# Patient Record
Sex: Female | Born: 1960 | Race: White | Hispanic: Yes | Marital: Married | Smoking: Never smoker
Health system: Southern US, Community
[De-identification: ages and names within clinical notes are randomized; demographics above are authoritative.]

## PROBLEM LIST (undated history)

## (undated) DIAGNOSIS — M549 Dorsalgia, unspecified: Secondary | ICD-10-CM

## (undated) DIAGNOSIS — Z789 Other specified health status: Secondary | ICD-10-CM

## (undated) DIAGNOSIS — R270 Ataxia, unspecified: Secondary | ICD-10-CM

## (undated) DIAGNOSIS — G8929 Other chronic pain: Secondary | ICD-10-CM

## (undated) HISTORY — PX: TUBAL LIGATION: SHX77

## (undated) HISTORY — PX: LAPAROSCOPIC OVARIAN CYSTECTOMY: SUR786

---

## 2007-08-25 ENCOUNTER — Emergency Department (HOSPITAL_COMMUNITY): Admission: EM | Admit: 2007-08-25 | Discharge: 2007-08-25 | Payer: Self-pay | Admitting: Emergency Medicine

## 2007-12-01 LAB — CONVERTED CEMR LAB: Pap Smear: NEGATIVE

## 2007-12-14 ENCOUNTER — Other Ambulatory Visit: Admission: RE | Admit: 2007-12-14 | Discharge: 2007-12-14 | Payer: Self-pay | Admitting: Gynecology

## 2008-04-12 ENCOUNTER — Ambulatory Visit: Payer: Self-pay | Admitting: Obstetrics & Gynecology

## 2009-07-03 ENCOUNTER — Ambulatory Visit: Payer: Self-pay | Admitting: Obstetrics & Gynecology

## 2009-07-03 ENCOUNTER — Encounter: Payer: Self-pay | Admitting: Obstetrics & Gynecology

## 2009-07-03 LAB — CONVERTED CEMR LAB: Pap Smear: NEGATIVE

## 2009-09-27 ENCOUNTER — Ambulatory Visit (HOSPITAL_COMMUNITY): Admission: RE | Admit: 2009-09-27 | Discharge: 2009-09-27 | Payer: Self-pay | Admitting: Family Medicine

## 2009-10-29 ENCOUNTER — Ambulatory Visit (HOSPITAL_COMMUNITY): Admission: RE | Admit: 2009-10-29 | Discharge: 2009-10-29 | Payer: Self-pay | Admitting: *Deleted

## 2009-10-29 ENCOUNTER — Ambulatory Visit: Payer: Self-pay | Admitting: Family Medicine

## 2009-10-29 DIAGNOSIS — M545 Low back pain: Secondary | ICD-10-CM

## 2009-10-29 DIAGNOSIS — M542 Cervicalgia: Secondary | ICD-10-CM

## 2009-10-29 LAB — CONVERTED CEMR LAB
Albumin: 4.2 g/dL (ref 3.5–5.2)
Alkaline Phosphatase: 54 units/L (ref 39–117)
CO2: 25 meq/L (ref 19–32)
Chloride: 104 meq/L (ref 96–112)
Creatinine, Ser: 0.57 mg/dL (ref 0.40–1.20)
Glucose, Bld: 97 mg/dL (ref 70–99)
Hemoglobin: 11.8 g/dL — ABNORMAL LOW (ref 12.0–15.0)
Platelets: 286 10*3/uL (ref 150–400)
RBC: 3.91 M/uL (ref 3.87–5.11)
Sodium: 140 meq/L (ref 135–145)

## 2009-11-12 ENCOUNTER — Ambulatory Visit: Payer: Self-pay | Admitting: Family Medicine

## 2009-11-12 DIAGNOSIS — J309 Allergic rhinitis, unspecified: Secondary | ICD-10-CM

## 2010-02-12 ENCOUNTER — Emergency Department (HOSPITAL_COMMUNITY): Admission: EM | Admit: 2010-02-12 | Discharge: 2010-02-12 | Payer: Self-pay | Admitting: Emergency Medicine

## 2010-02-12 ENCOUNTER — Ambulatory Visit: Payer: Self-pay | Admitting: Obstetrics and Gynecology

## 2010-02-24 ENCOUNTER — Ambulatory Visit (HOSPITAL_COMMUNITY): Admission: RE | Admit: 2010-02-24 | Discharge: 2010-02-24 | Payer: Self-pay | Admitting: Family Medicine

## 2010-02-24 ENCOUNTER — Encounter: Payer: Self-pay | Admitting: Family Medicine

## 2010-03-04 ENCOUNTER — Ambulatory Visit: Payer: Self-pay | Admitting: Family Medicine

## 2010-03-04 DIAGNOSIS — R109 Unspecified abdominal pain: Secondary | ICD-10-CM

## 2010-03-04 DIAGNOSIS — N7013 Chronic salpingitis and oophoritis: Secondary | ICD-10-CM

## 2010-03-05 ENCOUNTER — Encounter: Payer: Self-pay | Admitting: Family Medicine

## 2010-03-19 ENCOUNTER — Ambulatory Visit: Payer: Self-pay | Admitting: Obstetrics and Gynecology

## 2010-04-10 ENCOUNTER — Encounter: Payer: Self-pay | Admitting: Family Medicine

## 2010-04-24 ENCOUNTER — Ambulatory Visit: Payer: Self-pay | Admitting: Obstetrics and Gynecology

## 2010-09-29 ENCOUNTER — Encounter: Payer: Self-pay | Admitting: *Deleted

## 2010-09-29 ENCOUNTER — Ambulatory Visit: Payer: Self-pay | Admitting: Family Medicine

## 2010-11-26 IMAGING — US US TRANSVAGINAL NON-OB
1 series · 14 of 25 positions shown · non-contrast
Comparison: CT on 02/12/2010

CLINICAL DATA: Pelvic pain.  Uterine mass and cystic left adnexal
lesion noted on recent CT.

TRANSABDOMINAL AND TRANSVAGINAL ULTRASOUND OF PELVIS
TECHNIQUE: Both transabdominal and transvaginal ultrasound
examinations of the pelvis were performed including evaluation of
the uterus, ovaries, adnexal regions, and pelvic cul-de-sac.

[Series 1: us transvaginal non-ob · 0.27mm/px · 14 of 56 slices shown]
[im 1/56]
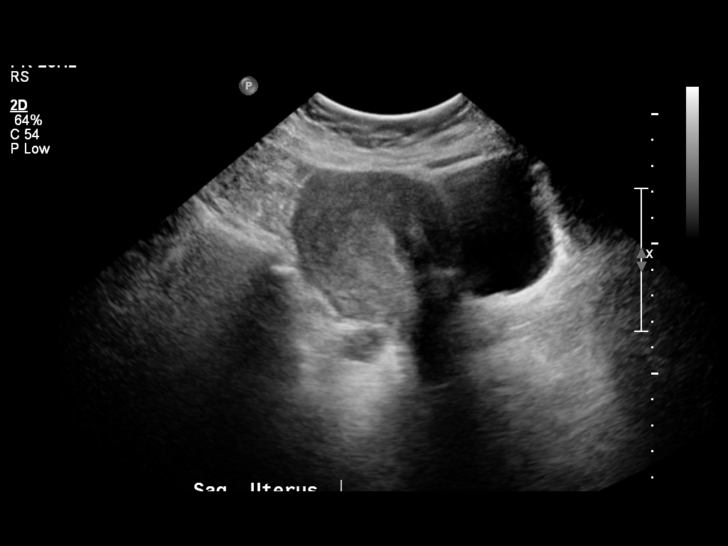
[im 5/56]
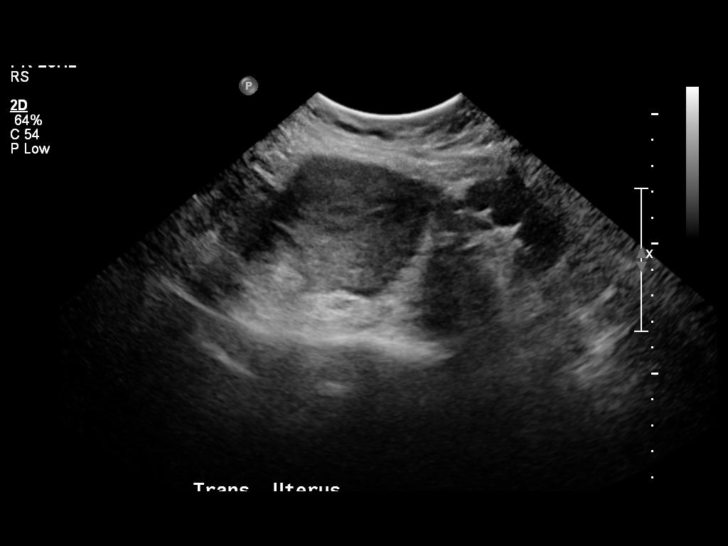
[im 10/56]
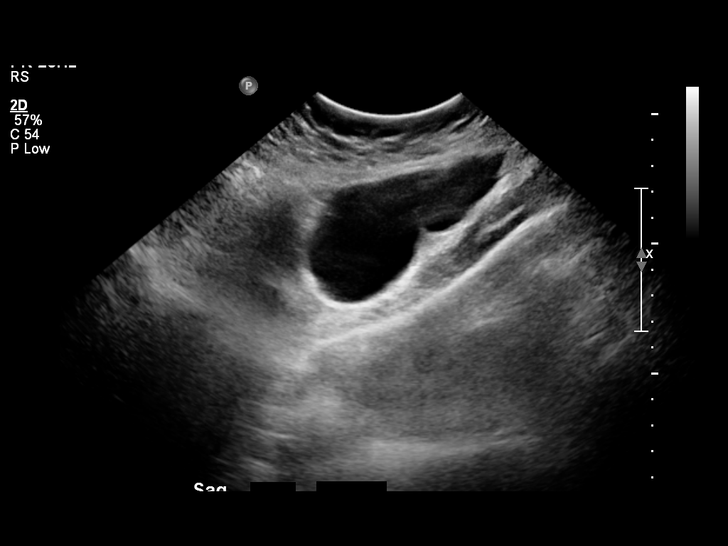
[im 14/56]
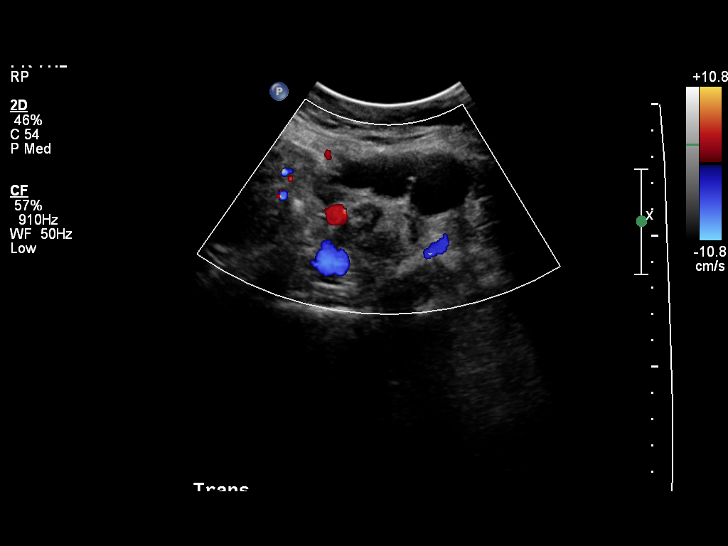
[im 19/56]
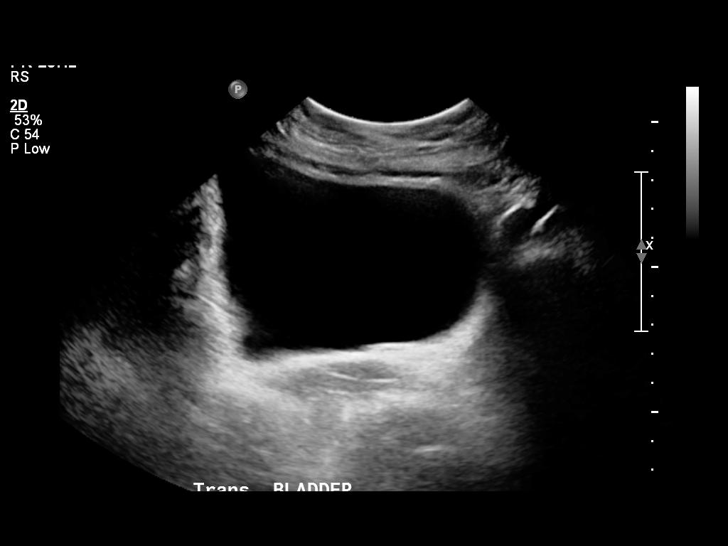
[im 21/56]
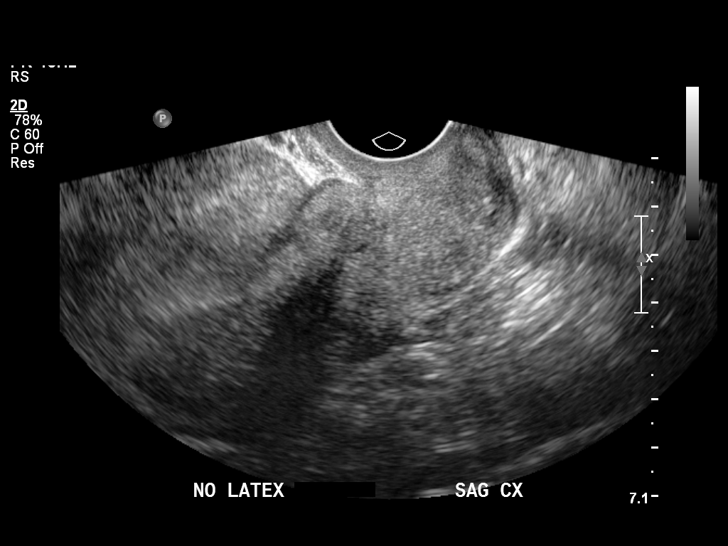
[im 26/56]
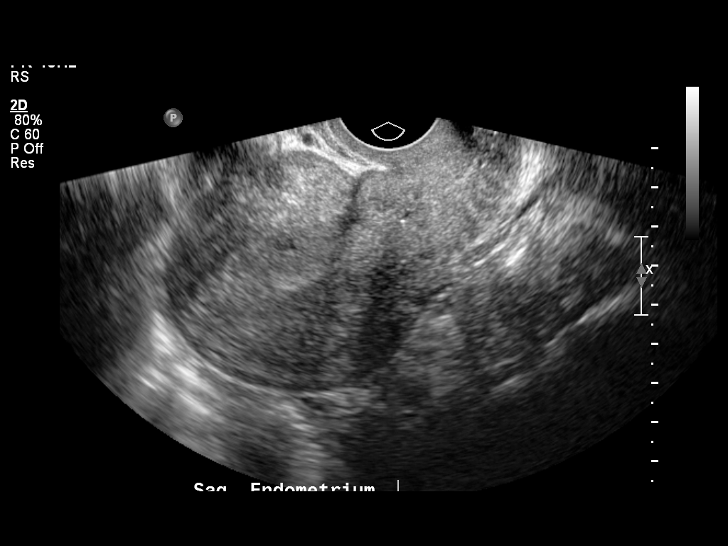
[im 30/56]
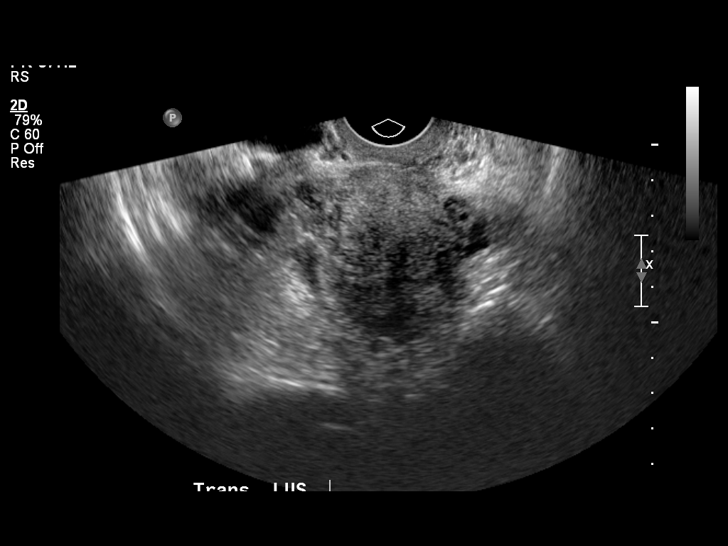
[im 35/56]
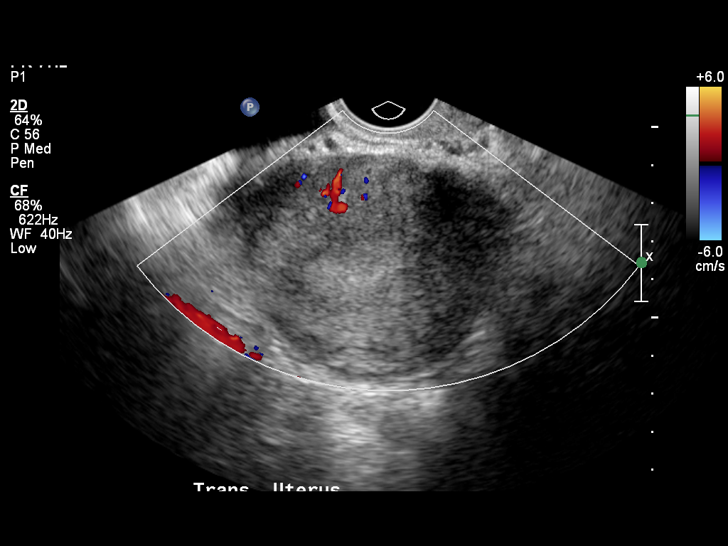
[im 37/56]
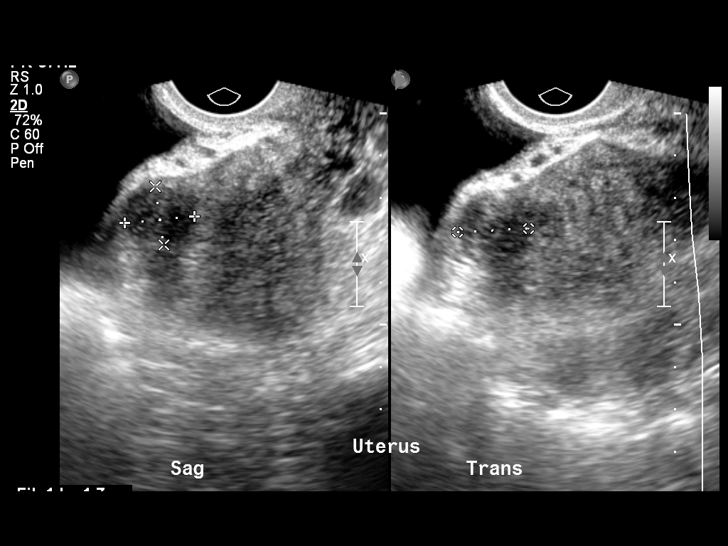
[im 42/56]
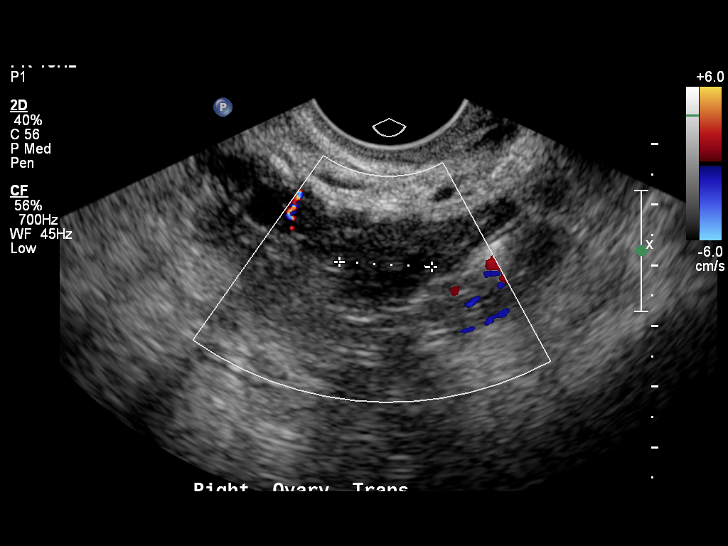
[im 46/56]
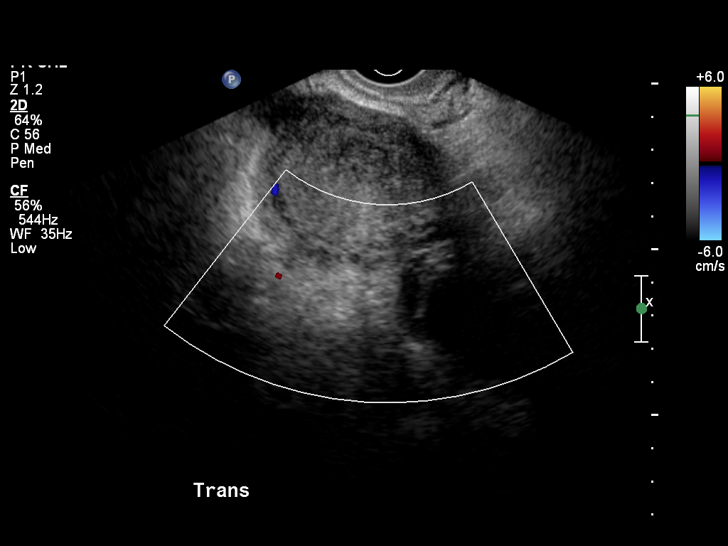
[im 51/56]
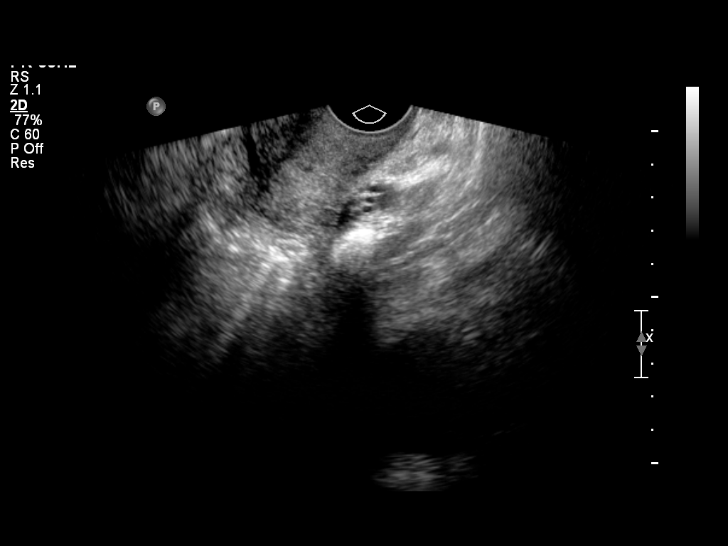
[im 56/56]
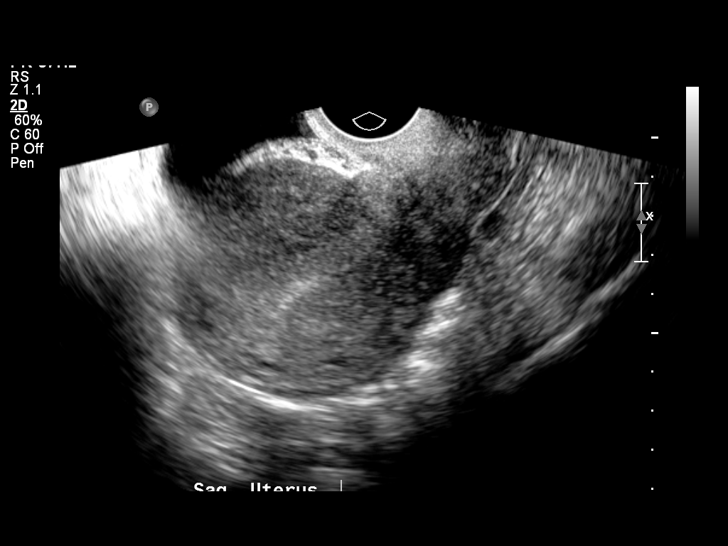

[14 of 25 positions shown; findings below may reference images not displayed]

FINDINGS: Uterus measures 9.8 x 5.8 x 6.3 cm. Diffusely heterogeneous
myometrial echotexture noted.  Previous cesarean section scar seen.
A subserosal fibroid is seen in the right anterior uterine body
measuring 1.7 cm.

Endometrium measures 10 mm in thickness.  Within normal limits in
appearance.

Right Ovary measures 2.6 x 1.1 x 1.5 cm. Normal appearance.

Left Ovary measures 2.3 x 1.5 x 1.7 cm.  Normal appearance.

Other Findings:  A tubular cystic structure is seen in the left
adnexa adjacent to the left ovary which measures approximately 4 x
8 cm.  This corresponds with the cystic lesion seen on recent CT.
IMPRESSION: 1.  Diffusely heterogeneous myometrium, with 1.7 cm fibroid.
2.  Normal ovaries.
3.  Moderate left hydrosalpinx, which corresponds to the cystic
lesion seen on recent CT.

## 2010-12-30 NOTE — Miscellaneous (Signed)
Summary: PT/TS  Clinical Lists Changes faxed PT request to [facility] location. original given to pt.Arlyss Repress CMA,  September 29, 2010 5:13 PM

## 2010-12-30 NOTE — Consult Note (Signed)
[facility]   Imported By: Bradly Bienenstock 03/05/2010 11:11:20  _____________________________________________________________________  External Attachment:    Type:   Image     Comment:   External Document  Appended Document: [facility] PMH of hysterectomy incorrect

## 2010-12-30 NOTE — Consult Note (Signed)
Summary: [facility]   Imported By: Clydell Hakim 05/02/2010 15:18:57  _____________________________________________________________________  External Attachment:    Type:   Image     Comment:   External Document

## 2010-12-30 NOTE — Assessment & Plan Note (Signed)
Summary: back pain/mj   Vital Signs:  Patient profile:   50 year old female Menstrual status:  regular Height:      59.75 inches Weight:      128.6 pounds BMI:     25.42 Pulse rate:   87 / minute BP sitting:   120 / 78  (right arm)  Vitals Entered By: Arlyss Repress CMA, (September 29, 2010 4:49 PM) CC: lower back pain Is Patient Diabetic? No Pain Assessment Patient in pain? yes     Location: lower back Intensity: 9 Onset of pain  x 1 week   Primary Care Provider:  Zachery Dauer MD  CC:  lower back pain.  History of Present Illness: 1. Lower back pain: - Pt presents with lower back pain for the past week - It started when she was at work, doing a lot of bending over - She has had this happen multiple times before and it would get better after a couple of weeks - Pain located diffusely in the lower back and some in the hips - Pain rated a 9/10 currently - Pain is also in the hips but it doesn't radiate. - Worse with bending over and with rotation - Vicodin and Advil makes it feel better  ROS: denies numbness / weakness, saddle anesthesia, loss of bowel / bladder function  SocHx:  Works cleaning houses  Habits & Providers  Alcohol-Tobacco-Diet     Tobacco Status: never  Current Medications (verified): 1)  Ibuprofen 200 Mg Tabs (Ibuprofen) .... Take 2 Tabs Three Times A Day With Meals 2)  Fluticasone Propionate 50 Mcg/act Susp (Fluticasone Propionate) .... 2 Sprays Each Nostril Daily  Allergies: No Known Drug Allergies  Physical Exam  General:  Vitals reviewed.  No acute distress Neck:  full ROM.   Lungs:  normal respiratory effort.   Heart:  normal rate and regular rhythm.   Msk:  Lower back:  No gross swelling, redness or warmth.  Full flexion.  Decreased extension and decreased rotation.  Non tender to palpation except minimally at the SI joints.  Negative SLR bilaterally.  Hips:  Decreased internal rotation otherwise normal ROM Extremities:  no LE  edema Neurologic:  strength normal in all extremities, sensation intact to light touch, gait normal, and DTRs symmetrical and normal.     Impression & Recommendations:  Problem # 1:  LOW BACK PAIN, CHRONIC (ICD-724.2) Assessment Deteriorated  Worse over the past week.  Likely exacerbated at work by doing so much bending over.  No red flags.  She prefers not to keep taking medicine.  Will send to PT for evaluation and treatment.  RTC in 4-6 weeks. Her updated medication list for this problem includes:    Ibuprofen 200 Mg Tabs (Ibuprofen) .Marland Kitchen... Take 2 tabs three times a day with meals  Orders: [facility]- Est Level  3 (16109)  Complete Medication List: 1)  Ibuprofen 200 Mg Tabs (Ibuprofen) .... Take 2 tabs three times a day with meals 2)  Fluticasone Propionate 50 Mcg/act Susp (Fluticasone propionate) .... 2 sprays each nostril daily  Patient Instructions: 1)  You likely strained your back from bending over so much at work 2)  We will send you to the physical therapist to see if they can help you with certain exercises to help you with your back pain and help prevent it from keep coming back 3)  Please schedule a follow up apppointment in 4-6 weeks to recheck your back   Orders Added: 1)  [facility]- Est  Level  3 [99213]

## 2010-12-30 NOTE — Letter (Signed)
Summary: *Referral Letter  [facility]  [address]   [place], [place] [address]   Phone: [phone]  Fax: [phone]    03/05/2010  Vanessa Beltran [address] [place], [place]  [address]  Phone: [phone]  Reason for Referral: Information for follow-up care. I saw Vanessa Beltran 03/04/10 and reviewed her history in [place]. Someone had told her that she might need colonoscopy. A rectal exam showed mild ext hemorrhoids and heme neg stools. Since she is not anemic, I don't believe colonoscopy is indicated. If her pain doesn't improve after treatment of her hydrosalpinx and is recurrently associated with diarrhea, I'll be happy to re-evaluate her. The urology consult from Dr Brunilda Payor indicated the small renal stones were not the cause of her pain.   Current Medical Problems: 1)  ALLERGIC RHINITIS, CHRONIC (ICD-477.9) 2)  COUGH (ICD-786.2) 3)  ABDOMINAL PAIN RIGHT UPPER QUADRANT (ICD-789.01) 4)  LOW BACK PAIN, CHRONIC (ICD-724.2) 5)  NECK PAIN (ICD-723.1) 6)  FAMILY HISTORY OF CAD FEMALE 1ST DEGREE RELATIVE <50 (ICD-V17.3) 7)  FAMILY HISTORY OF CAD FEMALE 1ST DEGREE RELATIVE <60 (ICD-V16.49) 8)  FAMILY HISTORY DIABETES 1ST DEGREE RELATIVE (ICD-V18.0)   Current Medications: 1)  IBUPROFEN 200 MG TABS (IBUPROFEN) Take 2 tabs three times a day with meals 2)  ROBAFEN AC 100-10 MG/5ML SYRP (GUAIFENESIN-CODEINE) Take 1-2 tsp every 4 hours as needed cough 3)  FLUTICASONE PROPIONATE 50 MCG/ACT SUSP (FLUTICASONE PROPIONATE) 2 sprays each nostril daily   Past Medical History: 1)  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 Loc.  WBC                                      10.3              4.0-10.5         K/uL  RBC  4.11              3.87-5.11        MIL/uL  Hemoglobin (HGB)                         12.5              12.0-15.0        g/dL  Hematocrit (HCT)                         38.8              36.0-46.0        %  MCV                                      94.3              78.0-100.0       fL  MCHC                                     32.3              30.0-36.0        g/dL  RDW                                      14.2              11.5-15.5        %  Platelet Count (PLT)                     244               150-400          K/uL  Neutrophils, %                           87         h      43-77            %  Lymphocytes, %                           7          l      12-46            %  Monocytes, %                             5                 3-12             %  Eosinophils, %                           1                 0-5              %  Basophils, %  0                 0-1              %  Neutrophils, Absolute                    8.9        h      1.7-7.7          K/uL  Lymphocytes, Absolute                    0.7               0.7-4.0          K/uL  Monocytes, Absolute                      0.5               0.1-1.0          K/uL  Eosinophils, Absolute                    0.1               0.0-0.7          K/uL  Basophils, Absolute                      0.0               0.0-0.1          K/uL  Additional Information  HL7 RESULT STATUS : F  External IF Update Timestamp : 2010-02-12:02:57:00.000000   Thank you again for agreeing to see our patient; please contact us if you have any further questions or need additional information.  Sincerely,  Zachery Dauer MD  Appended Document: *Referral Letter Faxed  to Dr. Jolayne Panther @ [phone]

## 2010-12-30 NOTE — Assessment & Plan Note (Signed)
Summary: needs referral to ob/gyn,tcb   Vital Signs:  Patient profile:   50 year old female Menstrual status:  regular Height:      59.75 inches Weight:      125 pounds BMI:     24.71 Temp:     97.5 degrees F oral BP sitting:   109 / 75  (left arm) Cuff size:   regular  Vitals Entered By: San Morelle, SMA CC: pt needs referral for colonscopy? Is Patient Diabetic? No Pain Assessment Patient in pain? no        Primary Care Provider:  Zachery Dauer MD  CC:  pt needs referral for colonscopy?Marland Kitchen  History of Present Illness: Every 3 nonths has colicky pain in lower abdomen associated with diarrhea lasts a few hours with 2-3 loose bowel movement. Last time one month ago. Can be on either side.  March visit to the ER for nausea, abdomen pain and fainted briefly and fell without injury. CT scan showed a left hydrosalpinx and small uterine fibroids so she was referred to Dr Jolayne Panther, gynecologist who ordered an ultrasound that confirmed those findings. She will see her again soon to make a decision about surgery if the finances have been arranged.   She was also referred to Dr Brunilda Payor for evaluation of small renal stones found on the CT scan. His consult states these are small and not the cause of the pain and recommended restudy in 6 months.   She is here today because she thought she needed a referral for a colonoscopy  Also complains of sinus congestion and bad smell and asks for treatment. Is taking over the counter medicine for congestion. Was given other pills before that helped, not an antibiotic.   Habits & Providers  Alcohol-Tobacco-Diet     Tobacco Status: never  Allergies: No Known Drug Allergies  Family History: MGF - Cancer of the skin,  intestine, lung died 50 Family History Diabetes 1st degree relative - mother Family History of CAD Female 1st degree relative 51 - father 5 Siblings well  Physical Exam  General:  alert, well-developed, and well-nourished.   Head:  No  maxillary tenderness. Indicates headache are in forehead.  Ears:  External ear exam shows no significant lesions or deformities.  Otoscopic examination reveals clear canals, tympanic membranes are intact bilaterally without bulging, retraction, inflammation or discharge. Hearing is grossly normal bilaterally. Nose:  External nasal examination shows no deformity or inflammation. Nasal mucosa are pink and moist without lesions or exudates. Mouth:  Oral mucosa and oropharynx without lesions or exudates.  Teeth in good repair. Neck:  No deformities, masses, or tenderness noted. Lungs:  Normal respiratory effort, chest expands symmetrically. Lungs are clear to auscultation, no crackles or wheezes. Heart:  Normal rate and regular rhythm. S1 and S2 normal without gallop, murmur, click, rub or other extra sounds. Abdomen:  Bowel sounds positive,abdomen soft and non-tender without masses, organomegaly or hernias noted. Rectal:  external hemorrhoidal tags.   no masses. Brown heme neg stool   Impression & Recommendations:  Problem # 1:  ABDOMINAL PAIN, LOWER (ICD-789.09)  Her updated medication list for this problem includes:    Ibuprofen 200 Mg Tabs (Ibuprofen) .Marland Kitchen... 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 2 sprays each nostril daily  Complete Medication List: 1)  Ibuprofen 200 Mg Tabs (Ibuprofen) .... Take 2 tabs three times a day with meals 2)  Fluticasone Propionate 50 Mcg/act Susp (Fluticasone propionate) .... 2 sprays each nostril daily  Patient Instructions: 1)  Puede usar el espray para la Darene Lamer y las pastillas de la farmacia  para sinusitis de Programmer, multimedia.  2)  Voy a Orthoptist.  Prescriptions: FLUTICASONE PROPIONATE 50 MCG/ACT SUSP (FLUTICASONE PROPIONATE) 2 sprays each nostril daily  #1 x 11   Entered and Authorized by:   Zachery Dauer MD   Signed by:   Zachery Dauer MD on 03/04/2010   Method used:   Print then Give to Patient   RxID:   [id number]    Vital Signs:  Patient profile:   50 year old female Menstrual status:  regular Height:      59.75 inches Weight:      125 pounds BMI:     24.71 Temp:     97.5 degrees F oral BP sitting:   109 / 75  (left arm) Cuff size:   regular  Vitals Entered By: San Morelle, SMA

## 2011-02-23 LAB — COMPREHENSIVE METABOLIC PANEL
AST: 19 U/L (ref 0–37)
Alkaline Phosphatase: 48 U/L (ref 39–117)
BUN: 14 mg/dL (ref 6–23)
Chloride: 106 mEq/L (ref 96–112)
GFR calc non Af Amer: >60 mL/min (ref >60)
Potassium: 3.3 mEq/L — ABNORMAL LOW (ref 3.5–5.1)
Sodium: 139 mEq/L (ref 135–145)
Total Bilirubin: 0.6 mg/dL (ref 0.3–1.2)

## 2011-02-23 LAB — URINALYSIS, ROUTINE W REFLEX MICROSCOPIC
Bilirubin Urine: NEGATIVE
Glucose, UA: NEGATIVE mg/dL
Leukocytes, UA: NEGATIVE
Protein, ur: 100 mg/dL — AB
Specific Gravity, Urine: 1.023 (ref 1.005–1.030)
pH: 7.5 (ref 5.0–8.0)

## 2011-02-23 LAB — CBC
Platelets: 244 10*3/uL (ref 150–400)
RDW: 14.2 % (ref 11.5–15.5)

## 2011-02-23 LAB — URINE CULTURE

## 2011-02-23 LAB — DIFFERENTIAL
Lymphs Abs: 0.7 10*3/uL (ref 0.7–4.0)
Monocytes Relative: 5 % (ref 3–12)

## 2011-02-23 LAB — URINE MICROSCOPIC-ADD ON

## 2011-04-14 NOTE — Group Therapy Note (Signed)
Vanessa Beltran, SPEGAL NO.:  [id number]   MEDICAL RECORD NO.:  [id number]          PATIENT TYPE:  WOC   LOCATION:  [facility]                   FACILITY:  [facility]   PHYSICIAN:  Jaynie Collins, MD     DATE OF BIRTH:  09/23/1961   DATE OF SERVICE:  07/03/2009                                  CLINIC NOTE   CHIEF COMPLAINT:  Annual exam.   HISTORY OF PRESENT ILLNESS:  The patient is a 50 year old gravida 3,  para 2-0-1-2 who was last seen in May 2009 for evaluation of chronic  left lower quadrant pain and left hydrosalpinx that was seen in  ultrasound.  At that visit, the patient was counseled regarding  laparoscopy for workup of her chronic pelvic pain and possible removal  of her left tube; however, she is self pay and the decision was made  that she would undergo financial counseling and come back for  preoperative planning.  The patient has not been seen since then and  today, she reports that she continues to have chronic left lower  quadrant pain.  She has not met with a Artist and wants  further information and how to set that up.  She says that she has been  having increasing dyspareunia because of the left hydrosalpinx and she  does desire surgical management.  Apart from her pain, she has no  gynecologic concerns.   PAST OB/GYN HISTORY:  The patient had two cesarean sections.  She has  normal menstrual periods.  She had a tubal ligation.  She has normal Pap  smears.  Last one was in January 2009, her last mammogram was in January  2009, which was negative.   PAST MEDICAL HISTORY:  None.   PAST SURGICAL HISTORY:  Two cesarean sections and a bilateral tubal  sterilization.   MEDICATIONS:  Antihistamine as needed.   ALLERGIES:  No known drug allergies.  ACIDIC FRUITS do cause her some  allergy.   SOCIAL HISTORY:  The patient does work outside the home.  She lives with  her family.  She denies any habits.  Denies any past or current history  of sexual or physical abuse.   FAMILY HISTORY:  Only remarkable for an unspecified type of cancer that  her grandfather had and arterial hypertension.  She denies any  gynecologic familial cancer history.   REVIEW OF SYSTEMS:  Unremarkable for chronic left lower quadrant pain.   PHYSICAL EXAMINATION:  VITALS:  97.7, pulse 66, blood pressure 147/86,  respirations 16, weight 124.5 pounds, height 5 feet.  GENERAL:  No apparent distress.  HEENT:  Normocephalic, atraumatic.  The patient does have about 1-cm  lesion under her right chin, which is flesh colored, symmetric in size.  She says that it has been excised in the past, but it regrew.  The  patient has no other neck masses.  NECK:  Supple.  Normal thyroid.  LUNGS:  Clear to auscultation bilaterally.  HEART:  Regular rate and rhythm.  BREASTS:  Symmetric in size, nontender.  No abnormal masses, skin  changes, nipple drainage, or lymphadenopathy noted.  LUNGS:  Clear to  auscultation bilaterally.  HEART:  Regular rate and rhythm.  ABDOMEN:  Soft, nontender, nondistended.  Well-healed Pfannenstiel  incision.  EXTREMITIES:  No cyanosis, clubbing, or edema.  PELVIC:  Normal external female genitalia.  Pink well rugated vagina.  No lesions.  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  Pap smear was obtained and endocervical sample was able to be  obtained, but after some difficulty.  On bimanual exam, the patient has  nontender uterus and had bilateral adnexal fullness, but no tenderness  on palpation.   ASSESSMENT/PLAN:  The patient is a 50 year old gravida 3, prior 2-0-1-2  here for annual exam.  Pap smear was sent.  The patient has a normal  breast examination and will be scheduled for mammogram at the end of  this visit.  For her left lower quadrant pain and persistent left  hydrosalpinx, the patient was told to meet with a financial counselor  and then call or make another appointment for preoperative  planning.  She agrees with this plan.           ______________________________  Jaynie Collins, MD     UA/MEDQ  D:  07/03/2009  T:  07/04/2009  Job:  [id number]

## 2013-06-16 ENCOUNTER — Other Ambulatory Visit (HOSPITAL_COMMUNITY): Payer: Self-pay | Admitting: Internal Medicine

## 2013-06-16 DIAGNOSIS — Z1231 Encounter for screening mammogram for malignant neoplasm of breast: Secondary | ICD-10-CM

## 2013-06-26 ENCOUNTER — Ambulatory Visit (HOSPITAL_COMMUNITY)
Admission: RE | Admit: 2013-06-26 | Discharge: 2013-06-26 | Disposition: A | Discharge status: Home / Self Care | Payer: BC Managed Care – PPO | Source: Ambulatory Visit | Attending: Internal Medicine | Admitting: Internal Medicine

## 2013-06-26 DIAGNOSIS — Z1231 Encounter for screening mammogram for malignant neoplasm of breast: Secondary | ICD-10-CM | POA: Insufficient documentation

## 2013-08-25 ENCOUNTER — Other Ambulatory Visit: Payer: Self-pay | Admitting: Obstetrics & Gynecology

## 2013-08-25 DIAGNOSIS — N9489 Other specified conditions associated with female genital organs and menstrual cycle: Secondary | ICD-10-CM

## 2013-09-05 ENCOUNTER — Ambulatory Visit
Admission: RE | Admit: 2013-09-05 | Discharge: 2013-09-05 | Disposition: A | Discharge status: Home / Self Care | Payer: BC Managed Care – PPO | Source: Ambulatory Visit | Attending: Obstetrics & Gynecology | Admitting: Obstetrics & Gynecology

## 2013-09-05 DIAGNOSIS — N9489 Other specified conditions associated with female genital organs and menstrual cycle: Secondary | ICD-10-CM

## 2013-09-05 MED ORDER — GADOBENATE DIMEGLUMINE 529 MG/ML IV SOLN
11.0000 mL | Freq: Once | INTRAVENOUS | Status: AC | PRN
  Administered 2013-09-05: 11 mL via INTRAVENOUS

## 2013-10-16 ENCOUNTER — Encounter (HOSPITAL_COMMUNITY)
Admission: RE | Admit: 2013-10-16 | Discharge: 2013-10-16 | Disposition: A | Discharge status: Home / Self Care | Payer: BC Managed Care – PPO | Source: Ambulatory Visit | Attending: Obstetrics & Gynecology | Admitting: Obstetrics & Gynecology

## 2013-10-16 ENCOUNTER — Encounter (HOSPITAL_COMMUNITY): Payer: Self-pay

## 2013-10-16 HISTORY — DX: Other specified health status: Z78.9

## 2013-10-16 LAB — CBC
Hemoglobin: 12.5 g/dL (ref 12.0–15.0)
MCH: 30.6 pg (ref 26.0–34.0)
MCHC: 33.4 g/dL (ref 30.0–36.0)
MCV: 91.7 fL (ref 78.0–100.0)
Platelets: 258 10*3/uL (ref 150–400)

## 2013-10-16 NOTE — Patient Instructions (Signed)
20 Vanessa Beltran  10/16/2013   Your procedure is scheduled on:  10/18/13  Enter through the Main Entrance of [facility] at 7 AM.  Pick up the phone at the desk and dial [phone].   Call this number if you have problems the morning of surgery: [phone]   Remember:   Do not eat food:After Midnight.  Do not drink clear liquids: After Midnight.  Take these medicines the morning of surgery with A SIP OF WATER: NA   Do not wear jewelry, make-up or nail polish.  Do not wear lotions, powders, or perfumes. You may wear deodorant.  Do not shave 48 hours prior to surgery.  Do not bring valuables to the hospital.  [facility] is not   responsible for any belongings or valuables brought to the hospital.  Contacts, dentures or bridgework may not be worn into surgery.  Leave suitcase in the car. After surgery it may be brought to your room.  For patients admitted to the hospital, checkout time is 11:00 AM the day of              discharge.   Patients discharged the day of surgery will not be allowed to drive             home.  Name and phone number of your driver: NA  Special Instructions:   Shower using CHG 2 nights before surgery and the night before surgery.  If you shower the day of surgery use CHG.  Use special wash - you have one bottle of CHG for all showers.  You should use approximately 1/3 of the bottle for each shower.   Please read over the following fact sheets that you were given:   Surgical Site Infection Prevention

## 2013-10-17 NOTE — H&P (Signed)
Vanessa Beltran is an 52 y.o. female with left-sided pelvic pain.  Ultrasound and MRI show likely 9.5 cm left hydrosalpinx.    Pertinent Gynecological History: Menses: with minimal cramping Bleeding: n/a Contraception: tubal ligation DES exposure: unknown Blood transfusions: none Sexually transmitted diseases: no past history Previous GYN Procedures: C/S x 2, BTL, l/s cystectomy  Last mammogram: normal Date: 2014 Last pap: normal Date: 2014 OB History: G3, P3  Menstrual History: Menarche age: n/a No LMP recorded.    Past Medical History  Diagnosis Date  . Medical history non-contributory     Past Surgical History  Procedure Laterality Date  . Tubal ligation    . Cesarean section    . Laparoscopic ovarian cystectomy      No family history on file.  Social History:  reports that she has never smoked. She does not have any smokeless tobacco history on file. She reports that she does not drink alcohol or use illicit drugs.  Allergies: Not on File  No prescriptions prior to admission    ROS  There were no vitals taken for this visit. Physical Exam  Constitutional: She is oriented to person, place, and time. She appears well-developed and well-nourished.  Cardiovascular: Normal rate and regular rhythm.   Respiratory: Effort normal and breath sounds normal.  GI: Soft. There is no rebound and no guarding.  Neurological: She is alert and oriented to person, place, and time.  Skin: Skin is warm and dry.  Psychiatric: She has a normal mood and affect. Her behavior is normal.    No results found for this or any previous visit (from the past 24 hour(s)).  No results found.  Assessment/Plan: 52yo with left sided pelvic pain; suspect hydrosalpinx Plan bilateral salpingo-oophorectomy  Sabre Romberger 10/17/2013, 7:57 PM

## 2013-10-18 ENCOUNTER — Ambulatory Visit (HOSPITAL_COMMUNITY): Payer: BC Managed Care – PPO | Admitting: Certified Registered"

## 2013-10-18 ENCOUNTER — Observation Stay (HOSPITAL_COMMUNITY)
Admission: RE | Admit: 2013-10-18 | Discharge: 2013-10-19 | Disposition: A | Discharge status: Home / Self Care | Payer: BC Managed Care – PPO | Source: Ambulatory Visit | Attending: Obstetrics & Gynecology | Admitting: Obstetrics & Gynecology

## 2013-10-18 ENCOUNTER — Encounter (HOSPITAL_COMMUNITY): Payer: Self-pay | Admitting: Certified Registered"

## 2013-10-18 ENCOUNTER — Encounter (HOSPITAL_COMMUNITY)
Admission: RE | Disposition: A | Discharge status: Home / Self Care | Payer: Self-pay | Source: Ambulatory Visit | Attending: Obstetrics & Gynecology

## 2013-10-18 ENCOUNTER — Encounter (HOSPITAL_COMMUNITY): Payer: BC Managed Care – PPO | Admitting: Certified Registered"

## 2013-10-18 DIAGNOSIS — N7013 Chronic salpingitis and oophoritis: Secondary | ICD-10-CM | POA: Insufficient documentation

## 2013-10-18 DIAGNOSIS — N83209 Unspecified ovarian cyst, unspecified side: Secondary | ICD-10-CM | POA: Insufficient documentation

## 2013-10-18 DIAGNOSIS — N9489 Other specified conditions associated with female genital organs and menstrual cycle: Secondary | ICD-10-CM | POA: Insufficient documentation

## 2013-10-18 DIAGNOSIS — N949 Unspecified condition associated with female genital organs and menstrual cycle: Principal | ICD-10-CM | POA: Insufficient documentation

## 2013-10-18 DIAGNOSIS — N736 Female pelvic peritoneal adhesions (postinfective): Secondary | ICD-10-CM | POA: Insufficient documentation

## 2013-10-18 DIAGNOSIS — N838 Other noninflammatory disorders of ovary, fallopian tube and broad ligament: Secondary | ICD-10-CM | POA: Insufficient documentation

## 2013-10-18 HISTORY — PX: LAPAROSCOPIC BILATERAL SALPINGECTOMY: SHX5889

## 2013-10-18 HISTORY — PX: CYSTOSCOPY: SHX5120

## 2013-10-18 HISTORY — PX: LAPAROSCOPIC LYSIS OF ADHESIONS: SHX5905

## 2013-10-18 HISTORY — PX: LAPAROTOMY: SHX154

## 2013-10-18 LAB — PREGNANCY, URINE: Preg Test, Ur: NEGATIVE

## 2013-10-18 SURGERY — SALPINGECTOMY, BILATERAL, LAPAROSCOPIC
Anesthesia: General | Site: Bladder | Laterality: Right | Wound class: Clean Contaminated

## 2013-10-18 MED ORDER — IBUPROFEN 600 MG PO TABS
600.0000 mg | ORAL_TABLET | Freq: Four times a day (QID) | ORAL | Status: DC | PRN
  Administered 2013-10-19: 600 mg via ORAL
  Filled 2013-10-18 (×2): qty 1

## 2013-10-18 MED ORDER — BUPIVACAINE HCL (PF) 0.25 % IJ SOLN
INTRAMUSCULAR | Status: DC | PRN
  Administered 2013-10-18: 8 mL

## 2013-10-18 MED ORDER — ROCURONIUM BROMIDE 100 MG/10ML IV SOLN
INTRAVENOUS | Status: DC | PRN
  Administered 2013-10-18: 10 mg via INTRAVENOUS
  Administered 2013-10-18: 40 mg via INTRAVENOUS

## 2013-10-18 MED ORDER — DEXAMETHASONE SODIUM PHOSPHATE 10 MG/ML IJ SOLN
INTRAMUSCULAR | Status: AC
  Filled 2013-10-18: qty 1

## 2013-10-18 MED ORDER — ONDANSETRON HCL 4 MG/2ML IJ SOLN
INTRAMUSCULAR | Status: AC
  Filled 2013-10-18: qty 2

## 2013-10-18 MED ORDER — LACTATED RINGERS IR SOLN
Status: DC | PRN
  Administered 2013-10-18: 3000 mL

## 2013-10-18 MED ORDER — BUPIVACAINE HCL (PF) 0.25 % IJ SOLN
INTRAMUSCULAR | Status: AC
  Filled 2013-10-18: qty 30

## 2013-10-18 MED ORDER — ROCURONIUM BROMIDE 100 MG/10ML IV SOLN
INTRAVENOUS | Status: AC
  Filled 2013-10-18: qty 1

## 2013-10-18 MED ORDER — METHYLENE BLUE 1 % INJ SOLN
INTRAMUSCULAR | Status: DC | PRN
  Administered 2013-10-18: 10 mL via INTRAVENOUS

## 2013-10-18 MED ORDER — DEXAMETHASONE SODIUM PHOSPHATE 10 MG/ML IJ SOLN
INTRAMUSCULAR | Status: DC | PRN
  Administered 2013-10-18: 10 mg via INTRAVENOUS

## 2013-10-18 MED ORDER — MIDAZOLAM HCL 2 MG/2ML IJ SOLN
INTRAMUSCULAR | Status: AC
  Filled 2013-10-18: qty 2

## 2013-10-18 MED ORDER — FENTANYL CITRATE 0.05 MG/ML IJ SOLN
INTRAMUSCULAR | Status: DC | PRN
  Administered 2013-10-18: 50 ug via INTRAVENOUS
  Administered 2013-10-18 (×2): 100 ug via INTRAVENOUS

## 2013-10-18 MED ORDER — GLYCOPYRROLATE 0.2 MG/ML IJ SOLN
INTRAMUSCULAR | Status: DC | PRN
  Administered 2013-10-18: .5 mg via INTRAVENOUS

## 2013-10-18 MED ORDER — STERILE WATER FOR IRRIGATION IR SOLN
Status: DC | PRN
  Administered 2013-10-18: 1000 mL via INTRAVESICAL

## 2013-10-18 MED ORDER — FENTANYL CITRATE 0.05 MG/ML IJ SOLN
INTRAMUSCULAR | Status: AC
  Administered 2013-10-18: 50 ug via INTRAVENOUS
  Filled 2013-10-18: qty 2

## 2013-10-18 MED ORDER — KETOROLAC TROMETHAMINE 30 MG/ML IJ SOLN
INTRAMUSCULAR | Status: AC
  Filled 2013-10-18: qty 1

## 2013-10-18 MED ORDER — LIDOCAINE HCL (CARDIAC) 20 MG/ML IV SOLN
INTRAVENOUS | Status: DC | PRN
  Administered 2013-10-18: 50 mg via INTRAVENOUS

## 2013-10-18 MED ORDER — LACTATED RINGERS IV SOLN
INTRAVENOUS | Status: DC
  Administered 2013-10-18 (×2): via INTRAVENOUS

## 2013-10-18 MED ORDER — MIDAZOLAM HCL 2 MG/2ML IJ SOLN
INTRAMUSCULAR | Status: DC | PRN
  Administered 2013-10-18: 2 mg via INTRAVENOUS

## 2013-10-18 MED ORDER — PROPOFOL 10 MG/ML IV BOLUS
INTRAVENOUS | Status: DC | PRN
  Administered 2013-10-18: 150 mg via INTRAVENOUS

## 2013-10-18 MED ORDER — DEXTROSE IN LACTATED RINGERS 5 % IV SOLN: INTRAVENOUS | Status: DC

## 2013-10-18 MED ORDER — HYDROMORPHONE HCL PF 1 MG/ML IJ SOLN
INTRAMUSCULAR | Status: DC | PRN
  Administered 2013-10-18: 1 mg via INTRAVENOUS

## 2013-10-18 MED ORDER — HYDROMORPHONE HCL PF 1 MG/ML IJ SOLN
0.2000 mg | INTRAMUSCULAR | Status: DC | PRN
  Administered 2013-10-18 – 2013-10-19 (×3): 0.6 mg via INTRAVENOUS
  Filled 2013-10-18 (×4): qty 1

## 2013-10-18 MED ORDER — SIMETHICONE 80 MG PO CHEW: 80.0000 mg | CHEWABLE_TABLET | Freq: Four times a day (QID) | ORAL | Status: DC | PRN

## 2013-10-18 MED ORDER — NEOSTIGMINE METHYLSULFATE 1 MG/ML IJ SOLN
INTRAMUSCULAR | Status: AC
  Filled 2013-10-18: qty 1

## 2013-10-18 MED ORDER — SODIUM CHLORIDE 0.9 % IJ SOLN
INTRAMUSCULAR | Status: DC | PRN
  Administered 2013-10-18: 10 mL

## 2013-10-18 MED ORDER — OXYCODONE-ACETAMINOPHEN 5-325 MG PO TABS
1.0000 | ORAL_TABLET | ORAL | Status: DC | PRN
  Administered 2013-10-19: 2 via ORAL
  Administered 2013-10-19: 1 via ORAL
  Filled 2013-10-18: qty 1
  Filled 2013-10-18: qty 2

## 2013-10-18 MED ORDER — HYDROMORPHONE HCL PF 1 MG/ML IJ SOLN
INTRAMUSCULAR | Status: AC
  Filled 2013-10-18: qty 1

## 2013-10-18 MED ORDER — KETOROLAC TROMETHAMINE 30 MG/ML IJ SOLN
INTRAMUSCULAR | Status: DC | PRN
  Administered 2013-10-18: 30 mg via INTRAVENOUS

## 2013-10-18 MED ORDER — CEFAZOLIN SODIUM-DEXTROSE 2-3 GM-% IV SOLR
INTRAVENOUS | Status: DC | PRN
  Administered 2013-10-18: 2 g via INTRAVENOUS

## 2013-10-18 MED ORDER — MENTHOL 3 MG MT LOZG
1.0000 | LOZENGE | OROMUCOSAL | Status: DC | PRN
  Administered 2013-10-18: 3 mg via ORAL
  Filled 2013-10-18: qty 9

## 2013-10-18 MED ORDER — GLYCOPYRROLATE 0.2 MG/ML IJ SOLN
INTRAMUSCULAR | Status: AC
  Filled 2013-10-18: qty 3

## 2013-10-18 MED ORDER — ONDANSETRON HCL 4 MG PO TABS: 4.0000 mg | ORAL_TABLET | Freq: Four times a day (QID) | ORAL | Status: DC | PRN

## 2013-10-18 MED ORDER — SODIUM CHLORIDE 0.9 % IJ SOLN
INTRAMUSCULAR | Status: AC
  Filled 2013-10-18: qty 10

## 2013-10-18 MED ORDER — FENTANYL CITRATE 0.05 MG/ML IJ SOLN
INTRAMUSCULAR | Status: AC
  Filled 2013-10-18: qty 5

## 2013-10-18 MED ORDER — KETOROLAC TROMETHAMINE 30 MG/ML IJ SOLN
30.0000 mg | Freq: Four times a day (QID) | INTRAMUSCULAR | Status: DC
  Administered 2013-10-18 – 2013-10-19 (×3): 30 mg via INTRAVENOUS
  Filled 2013-10-18 (×3): qty 1

## 2013-10-18 MED ORDER — LIDOCAINE HCL (CARDIAC) 20 MG/ML IV SOLN
INTRAVENOUS | Status: AC
  Filled 2013-10-18: qty 5

## 2013-10-18 MED ORDER — DOCUSATE SODIUM 100 MG PO CAPS
100.0000 mg | ORAL_CAPSULE | Freq: Two times a day (BID) | ORAL | Status: DC
  Administered 2013-10-19: 100 mg via ORAL
  Filled 2013-10-18: qty 1

## 2013-10-18 MED ORDER — PROPOFOL 10 MG/ML IV EMUL
INTRAVENOUS | Status: AC
  Filled 2013-10-18: qty 20

## 2013-10-18 MED ORDER — NEOSTIGMINE METHYLSULFATE 1 MG/ML IJ SOLN
INTRAMUSCULAR | Status: DC | PRN
  Administered 2013-10-18: 3 mg via INTRAVENOUS

## 2013-10-18 MED ORDER — ONDANSETRON HCL 4 MG/2ML IJ SOLN
4.0000 mg | Freq: Four times a day (QID) | INTRAMUSCULAR | Status: DC | PRN
  Administered 2013-10-18: 4 mg via INTRAVENOUS
  Filled 2013-10-18 (×2): qty 2

## 2013-10-18 MED ORDER — ONDANSETRON HCL 4 MG/2ML IJ SOLN
INTRAMUSCULAR | Status: DC | PRN
  Administered 2013-10-18: 4 mg via INTRAVENOUS

## 2013-10-18 MED ORDER — DEXTROSE IN LACTATED RINGERS 5 % IV SOLN
INTRAVENOUS | Status: DC
  Administered 2013-10-18 – 2013-10-19 (×2): via INTRAVENOUS

## 2013-10-18 MED ORDER — EPHEDRINE 5 MG/ML INJ
INTRAVENOUS | Status: AC
  Filled 2013-10-18: qty 10

## 2013-10-18 MED ORDER — EPHEDRINE SULFATE 50 MG/ML IJ SOLN
INTRAMUSCULAR | Status: DC | PRN
  Administered 2013-10-18: 10 mg via INTRAVENOUS
  Administered 2013-10-18: 5 mg via INTRAVENOUS

## 2013-10-18 MED ORDER — FENTANYL CITRATE 0.05 MG/ML IJ SOLN
25.0000 ug | INTRAMUSCULAR | Status: DC | PRN
  Administered 2013-10-18: 25 ug via INTRAVENOUS
  Administered 2013-10-18: 50 ug via INTRAVENOUS

## 2013-10-18 SURGICAL SUPPLY — 53 items
BARRIER ADHS 3X4 INTERCEED (GAUZE/BANDAGES/DRESSINGS) IMPLANT
BLADE SURG 10 STRL SS (BLADE) ×10 IMPLANT
CABLE HIGH FREQUENCY MONO STRZ (ELECTRODE) ×5 IMPLANT
CATH ROBINSON RED A/P 16FR (CATHETERS) ×5 IMPLANT
CELLS DAT CNTRL 66122 CELL SVR (MISCELLANEOUS) ×4 IMPLANT
CHLORAPREP W/TINT 26ML (MISCELLANEOUS) ×5 IMPLANT
CLEANER TIP ELECTROSURG 2X2 (MISCELLANEOUS) ×5 IMPLANT
CLOTH BEACON ORANGE TIMEOUT ST (SAFETY) ×5 IMPLANT
COVER LIGHT HANDLE  1/PK (MISCELLANEOUS) ×1
COVER LIGHT HANDLE 1/PK (MISCELLANEOUS) ×4 IMPLANT
COVER MAYO STAND STRL (DRAPES) ×5 IMPLANT
COVER TABLE BACK 60X90 (DRAPES) ×5 IMPLANT
DERMABOND ADVANCED (GAUZE/BANDAGES/DRESSINGS) ×1
DERMABOND ADVANCED .7 DNX12 (GAUZE/BANDAGES/DRESSINGS) ×4 IMPLANT
DILATOR CANAL MILEX (MISCELLANEOUS) IMPLANT
DRSG OPSITE POSTOP 4X10 (GAUZE/BANDAGES/DRESSINGS) ×5 IMPLANT
ENSEAL DEVICE STD TIP 35CM (ENDOMECHANICALS) IMPLANT
FORCEPS CUTTING 45CM 5MM (CUTTING FORCEPS) ×5 IMPLANT
GLOVE BIO SURGEON STRL SZ 6 (GLOVE) ×5 IMPLANT
GLOVE BIOGEL PI IND STRL 6 (GLOVE) ×8 IMPLANT
GLOVE BIOGEL PI INDICATOR 6 (GLOVE) ×2
GOWN PREVENTION PLUS LG XLONG (DISPOSABLE) ×10 IMPLANT
MANIPULATOR UTERINE 4.5 ZUMI (MISCELLANEOUS) ×5 IMPLANT
NEEDLE INSUFFLATION 120MM (ENDOMECHANICALS) ×5 IMPLANT
NS IRRIG 1000ML POUR BTL (IV SOLUTION) IMPLANT
PACK LAPAROSCOPY BASIN (CUSTOM PROCEDURE TRAY) ×5 IMPLANT
PENCIL BUTTON HOLSTER BLD 10FT (ELECTRODE) ×10 IMPLANT
POUCH SPECIMEN RETRIEVAL 10MM (ENDOMECHANICALS) IMPLANT
PROTECTOR NERVE ULNAR (MISCELLANEOUS) ×5 IMPLANT
RTRCTR WOUND ALEXIS 18CM MED (MISCELLANEOUS) ×5
SEALER TISSUE G2 CVD JAW 45CM (ENDOMECHANICALS) IMPLANT
SET CYSTO W/LG BORE CLAMP LF (SET/KITS/TRAYS/PACK) ×5 IMPLANT
SET IRRIG TUBING LAPAROSCOPIC (IRRIGATION / IRRIGATOR) IMPLANT
SPONGE LAP 18X18 X RAY DECT (DISPOSABLE) ×20 IMPLANT
SPONGE SURGIFOAM ABS GEL 12-7 (HEMOSTASIS) ×5 IMPLANT
STRIP CLOSURE SKIN 1/2X4 (GAUZE/BANDAGES/DRESSINGS) IMPLANT
SUT MNCRL 0 MO-4 VIOLET 18 CR (SUTURE) ×4 IMPLANT
SUT MNCRL AB 3-0 PS2 27 (SUTURE) ×5 IMPLANT
SUT MON AB-0 CT1 36 (SUTURE) ×10 IMPLANT
SUT MONOCRYL 0 MO 4 18  CR/8 (SUTURE) ×1
SUT PDS AB 0 CTX 60 (SUTURE) ×5 IMPLANT
SUT VIC AB 3-0 SH 27 (SUTURE) ×2
SUT VIC AB 3-0 SH 27XBRD (SUTURE) ×8 IMPLANT
SUT VICRYL 0 TIES 12 18 (SUTURE) ×5 IMPLANT
SUT VICRYL 0 UR6 27IN ABS (SUTURE) ×5 IMPLANT
SYRINGE 10CC LL (SYRINGE) ×5 IMPLANT
TOWEL OR 17X24 6PK STRL BLUE (TOWEL DISPOSABLE) ×10 IMPLANT
TROCAR XCEL NON-BLD 11X100MML (ENDOMECHANICALS) ×5 IMPLANT
TROCAR XCEL NON-BLD 5MMX100MML (ENDOMECHANICALS) ×10 IMPLANT
TUBING CONNECTOR 18X5MM (MISCELLANEOUS) ×5 IMPLANT
WARMER LAPAROSCOPE (MISCELLANEOUS) ×5 IMPLANT
WATER STERILE IRR 1000ML POUR (IV SOLUTION) ×5 IMPLANT
YANKAUER SUCT BULB TIP NO VENT (SUCTIONS) ×5 IMPLANT

## 2013-10-18 NOTE — Anesthesia Preprocedure Evaluation (Signed)
Anesthesia Evaluation Anesthesia Physical Anesthesia Plan  ASA: II  Anesthesia Plan: General   Post-op Pain Management:    Induction: Intravenous  Airway Management Planned: Oral ETT  Additional Equipment:   Intra-op Plan:   Post-operative Plan: Extubation in OR  Informed Consent: I have reviewed the patients History and Physical, chart, labs and discussed the procedure including the risks, benefits and alternatives for the proposed anesthesia with the patient or authorized representative who has indicated his/her understanding and acceptance.   Dental Advisory Given and Dental advisory given  Plan Discussed with: CRNA and Surgeon  Anesthesia Plan Comments: (  Discussed general anesthesia, including possible nausea, instrumentation of airway, sore throat,pulmonary aspiration, etc. I asked if the were any outstanding questions, or  concerns before we proceeded. )        Anesthesia Quick Evaluation  

## 2013-10-18 NOTE — Transfer of Care (Signed)
Immediate Anesthesia Transfer of Care Note  Patient: Vanessa Beltran  Procedure(s) Performed: Procedure(s):  LAPAROSCOPIC RIGHT SALPINGECTOMY (Right) LAPAROSCOPIC EXTENSIVE LYSIS OF ADHESIONS (N/A) LAPAROTOMY, LEFT SALPINGO-OOPHORECTOMY, LYSIS OF ADHESIONS (Left) CYSTOSCOPY (N/A)  Patient Location: PACU  Anesthesia Type:General  Level of Consciousness: awake, alert  and oriented  Airway & Oxygen Therapy: Patient Spontanous Breathing and Patient connected to nasal cannula oxygen  Post-op Assessment: Report given to PACU RN, Post -op Vital signs reviewed and stable and Patient moving all extremities  Post vital signs: Reviewed and stable  Complications: No apparent anesthesia complications

## 2013-10-18 NOTE — Anesthesia Procedure Notes (Signed)
Procedure Name: Intubation Date/Time: 10/18/2013 8:52 AM Performed by: Rossie Muskrat L Pre-anesthesia Checklist: Patient identified, Emergency Drugs available, Suction available, Patient being monitored and Timeout performed Patient Re-evaluated:Patient Re-evaluated prior to inductionOxygen Delivery Method: Circle system utilized Preoxygenation: Pre-oxygenation with 100% oxygen Intubation Type: IV induction Ventilation: Mask ventilation without difficulty Laryngoscope Size: Mac and 3 Grade View: Grade III Tube type: Oral Tube size: 7.0 mm Number of attempts: 2 Airway Equipment and Method: Stylet Placement Confirmation: ETT inserted through vocal cords under direct vision,  positive ETCO2 and breath sounds checked- equal and bilateral Secured at: 21 cm Tube secured with: Tape Dental Injury: Teeth and Oropharynx as per pre-operative assessment  Comments: DL x 1 with MAC 3 by Graylin Shiver CRNA, unable to visualize cords. DL x 1 by Dr. Rodman Pickle, atraumatic intubation performed.
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
## 2013-10-18 NOTE — Anesthesia Postprocedure Evaluation (Signed)
  Anesthesia Post-op Note  Patient: Ship broker  Procedure(s) Performed: Procedure(s):  LAPAROSCOPIC RIGHT SALPINGECTOMY (Right) LAPAROSCOPIC EXTENSIVE LYSIS OF ADHESIONS (N/A) LAPAROTOMY, LEFT SALPINGO-OOPHORECTOMY, LYSIS OF ADHESIONS (Left) CYSTOSCOPY (N/A)  Patient Location: PACU and Women's Unit  Anesthesia Type:General  Level of Consciousness: awake, alert , oriented and patient cooperative  Airway and Oxygen Therapy: Patient Spontanous Breathing  Post-op Pain: mild  Post-op Assessment: Post-op Vital signs reviewed, Patient's Cardiovascular Status Stable and Respiratory Function Stable  Post-op Vital Signs: Reviewed and stable  Complications: No apparent anesthesia complications

## 2013-10-18 NOTE — Anesthesia Postprocedure Evaluation (Signed)
  Anesthesia Post-op Note  Anesthesia Post Note  Patient: Ship broker  Procedure(s) Performed: Procedure(s) (LRB):  LAPAROSCOPIC RIGHT SALPINGECTOMY (Right) LAPAROSCOPIC EXTENSIVE LYSIS OF ADHESIONS (N/A) LAPAROTOMY, LEFT SALPINGO-OOPHORECTOMY, LYSIS OF ADHESIONS (Left) CYSTOSCOPY (N/A)  Anesthesia type: General  Patient location: PACU  Post pain: Pain level controlled  Post assessment: Post-op Vital signs reviewed  Last Vitals:  Filed Vitals:   10/18/13 1345  BP: 108/59  Pulse: 71  Temp:   Resp: 24    Post vital signs: Reviewed  Level of consciousness: sedated  Complications: No apparent anesthesia complications

## 2013-10-18 NOTE — Progress Notes (Signed)
C/o pain; improved with IV pain meds.  No N/V.  No CP/SOB.  Tolerating clears.    VSS.  AF. UOP adequate Gen: A&O x 3 Abd: soft, ND, dressing c/d/i with l/s inc c/d/i. Ext: +SCDs  52yo POD#0 s/p l/s->open right salpingectomy, LSO, cysto.   -Will continue to improve pain control -Will D/C foley and IV in am -Advance diet as tolerated

## 2013-10-18 NOTE — Preoperative (Signed)
Beta Blockers   Reason not to administer Beta Blockers:Not Applicable 

## 2013-10-18 NOTE — Progress Notes (Signed)
No change to H&P. 

## 2013-10-18 NOTE — Addendum Note (Signed)
Addendum created 10/18/13 1442 by Turner Daniels, CRNA   Modules edited: Anesthesia Medication Administration

## 2013-10-19 ENCOUNTER — Encounter (HOSPITAL_COMMUNITY): Payer: Self-pay | Admitting: Obstetrics & Gynecology

## 2013-10-19 LAB — CBC
HCT: 27.4 % — ABNORMAL LOW (ref 36.0–46.0)
Hemoglobin: 9.3 g/dL — ABNORMAL LOW (ref 12.0–15.0)
MCV: 91.3 fL (ref 78.0–100.0)
Platelets: 201 10*3/uL (ref 150–400)
RBC: 3 MIL/uL — ABNORMAL LOW (ref 3.87–5.11)
WBC: 8.9 10*3/uL (ref 4.0–10.5)

## 2013-10-19 MED ORDER — SODIUM CHLORIDE 0.9 % IJ SOLN
3.0000 mL | Freq: Two times a day (BID) | INTRAMUSCULAR | Status: DC
  Administered 2013-10-19: 3 mL via INTRAVENOUS

## 2013-10-19 MED ORDER — OXYCODONE-ACETAMINOPHEN 5-325 MG PO TABS: 1.0000 | ORAL_TABLET | ORAL | Status: DC | PRN

## 2013-10-19 MED ORDER — LORATADINE 10 MG PO TABS
10.0000 mg | ORAL_TABLET | Freq: Every day | ORAL | Status: DC
  Administered 2013-10-19: 10 mg via ORAL
  Filled 2013-10-19 (×2): qty 1

## 2013-10-19 MED ORDER — IBUPROFEN 600 MG PO TABS: 600.0000 mg | ORAL_TABLET | Freq: Four times a day (QID) | ORAL | Status: DC | PRN

## 2013-10-19 MED ORDER — SODIUM CHLORIDE 0.9 % IJ SOLN: 3.0000 mL | INTRAMUSCULAR | Status: DC | PRN

## 2013-10-19 NOTE — Progress Notes (Signed)
Pt discharged to home with husband, son & daughter-in-law, and another adult woman.  Condition stable.  Discharge instructions reviewed with pt using hospital interpreter, Mayra.  Pt to car via wheelchair with Stevphen Meuse, NT.  No equipment for home ordered at discharge.

## 2013-10-19 NOTE — Progress Notes (Signed)
Patient requesting early discharge tonight.  She reports good pain control with po meds.  No nausea since last night.  She is ambulating well and tolerating po.  She is voiding without difficulty and passing flatus.    VSS.  AF.  Continued adequate UOP.   Gen: A&O x 3 Abd: soft, ND, inc c/d/i with honeycomb dressing in place Ext: no c/c/e  52yo POD#1 s/p L/S->laparotomy with loa, right salpingectomy and LSO -Will d/c home with close office follow-up -D/C home with percocet and motrin

## 2013-10-19 NOTE — Progress Notes (Signed)
1 Day Post-Op Procedure(s) (LRB):  LAPAROSCOPIC RIGHT SALPINGECTOMY (Right) LAPAROSCOPIC EXTENSIVE LYSIS OF ADHESIONS (N/A) LAPAROTOMY, LEFT SALPINGO-OOPHORECTOMY, LYSIS OF ADHESIONS (Left) CYSTOSCOPY (N/A)  Subjective: Patient reports tolerating PO.  Pain better controlled.  Minimal nausea last night; none currently.  Objective: I have reviewed patient's vital signs, intake and output, medications and labs.  General: alert, cooperative and appears stated age GI: normal findings: soft, nondistended and incision: clean and dry Extremities: extremities normal, atraumatic, no cyanosis or edema  Assessment: s/p Procedure(s):  LAPAROSCOPIC RIGHT SALPINGECTOMY (Right) LAPAROSCOPIC EXTENSIVE LYSIS OF ADHESIONS (N/A) LAPAROTOMY, LEFT SALPINGO-OOPHORECTOMY, LYSIS OF ADHESIONS (Left) CYSTOSCOPY (N/A): stable  Plan: Advance diet Encourage ambulation Advance to PO medication Discontinue IV fluids Discontinue foley  LOS: 1 day    Vanessa Beltran 10/19/2013, 7:20 AM

## 2013-10-19 NOTE — Discharge Summary (Signed)
Physician Discharge Summary  Patient ID: Vanessa Beltran MRN: [id number] DOB/AGE: 02/14/1961 52 y.o.  Admit date: 10/18/2013 Discharge date: 10/19/2013  Admission Diagnoses:  52yo with pelvic pain and left adnexal mass  Discharge Diagnoses: SAA with hydrosalpinx and severe adhesive disease. Active Problems:   * No active hospital problems. *   Discharged Condition: good  Hospital Course: 52yo admitted with hydrosalpinx and severe adhesive disease.  She underwent laparoscopic right salpingectomy but because of severe adhesions, converted to open procedure.  The left ovary was so densely adherent to the left tube, LSO was performed along with lysis of adhesions.  Patient did very well postoperatively and strongly desired discharge on night of POD#1.  She was meeting all goals so she was discharged with close follow-up in the office.      Consults: None  Significant Diagnostic Studies: labs: Hgb 9.4  Treatments: IV hydration, analgesia: Dilaudid and Percocet and surgery: L/s right salpingectomy, abdominal LSO, LOA, cystoscopy  Discharge Exam: Blood pressure 94/59, pulse 72, temperature 98.4 F (36.9 C), temperature source Oral, resp. rate 16, height 5' (1.524 m), weight 129 lb (58.514 kg), SpO2 99.00%. General appearance: alert, cooperative and appears stated age GI: soft, ND.  Inc c/d/i, honeycomb in place and dry. Extremities: extremities normal, atraumatic, no cyanosis or edema Incision/Wound:c/d/i  Disposition: Final discharge disposition not confirmed     Medication List         fluticasone 50 MCG/ACT nasal spray  Commonly known as:  FLONASE  - 2 sprays by Nasal route daily. Each nostril  -      ibuprofen 600 MG tablet  Commonly known as:  ADVIL,MOTRIN  Take 1 tablet (600 mg total) by mouth every 6 (six) hours as needed (mild pain).     oxyCODONE-acetaminophen 5-325 MG per tablet  Commonly known as:  PERCOCET/ROXICET  Take 1-2 tablets by mouth every 4 (four) hours  as needed for severe pain (moderate to severe pain (when tolerating fluids)).         SignedMitchel Honour 10/19/2013, 6:04 PM

## 2014-08-26 ENCOUNTER — Emergency Department (HOSPITAL_COMMUNITY)
Admission: EM | Admit: 2014-08-26 | Discharge: 2014-08-26 | Disposition: A | Discharge status: Home / Self Care | Payer: BC Managed Care – PPO | Attending: Emergency Medicine | Admitting: Emergency Medicine

## 2014-08-26 ENCOUNTER — Encounter (HOSPITAL_COMMUNITY): Payer: Self-pay | Admitting: Emergency Medicine

## 2014-08-26 DIAGNOSIS — R51 Headache: Secondary | ICD-10-CM | POA: Diagnosis present

## 2014-08-26 DIAGNOSIS — G43009 Migraine without aura, not intractable, without status migrainosus: Secondary | ICD-10-CM

## 2014-08-26 DIAGNOSIS — G43909 Migraine, unspecified, not intractable, without status migrainosus: Secondary | ICD-10-CM | POA: Insufficient documentation

## 2014-08-26 MED ORDER — KETOROLAC TROMETHAMINE 30 MG/ML IJ SOLN
30.0000 mg | Freq: Once | INTRAMUSCULAR | Status: AC
  Administered 2014-08-26: 30 mg via INTRAVENOUS
  Filled 2014-08-26: qty 1

## 2014-08-26 MED ORDER — DEXAMETHASONE SODIUM PHOSPHATE 4 MG/ML IJ SOLN
10.0000 mg | Freq: Once | INTRAMUSCULAR | Status: AC
  Administered 2014-08-26: 10 mg via INTRAVENOUS

## 2014-08-26 MED ORDER — METOCLOPRAMIDE HCL 5 MG/ML IJ SOLN
10.0000 mg | Freq: Once | INTRAMUSCULAR | Status: AC
  Administered 2014-08-26: 10 mg via INTRAVENOUS
  Filled 2014-08-26: qty 2

## 2014-08-26 MED ORDER — BUTALBITAL-APAP-CAFFEINE 50-325-40 MG PO TABS: 1.0000 | ORAL_TABLET | Freq: Four times a day (QID) | ORAL | Status: AC | PRN

## 2014-08-26 MED ORDER — METOCLOPRAMIDE HCL 5 MG/ML IJ SOLN: 10.0000 mg | Freq: Once | INTRAMUSCULAR | Status: DC

## 2014-08-26 MED ORDER — DIPHENHYDRAMINE HCL 50 MG/ML IJ SOLN
25.0000 mg | Freq: Once | INTRAMUSCULAR | Status: AC
  Administered 2014-08-26: 25 mg via INTRAVENOUS
  Filled 2014-08-26: qty 1

## 2014-08-26 MED ORDER — SODIUM CHLORIDE 0.9 % IV BOLUS (SEPSIS)
1000.0000 mL | Freq: Once | INTRAVENOUS | Status: AC
  Administered 2014-08-26: 1000 mL via INTRAVENOUS

## 2014-08-26 MED ORDER — DEXAMETHASONE SODIUM PHOSPHATE 10 MG/ML IJ SOLN: 10.0000 mg | Freq: Once | INTRAMUSCULAR | Status: DC

## 2014-08-26 NOTE — ED Provider Notes (Signed)
TIME SEEN: 4:10 AM  CHIEF COMPLAINT: Migraine headache  HPI: Patient is a 53 y.o. F with history of migraines who presents to the emergency department with one week of intermittent diffuse throbbing headache that is worse with light and sound with associated nausea and vomiting. She's had sore headaches before but states it has been several years. No head injury. She is not on anticoagulation. No numbness currently or focal weakness. No fevers or neck pain. No sudden onset, thunderclap headache. No worst headache of her life.  ROS: See HPI Constitutional: no fever  Eyes: no drainage  ENT: no runny nose   Cardiovascular:  no chest pain  Resp: no SOB  GI: no vomiting GU: no dysuria Integumentary: no rash  Allergy: no hives  Musculoskeletal: no leg swelling  Neurological: no slurred speech ROS otherwise negative  PAST MEDICAL HISTORY/PAST SURGICAL HISTORY:  Past Medical History  Diagnosis Date  . Medical history non-contributory     MEDICATIONS:  Prior to Admission medications   Medication Sig Start Date End Date Taking? Authorizing Provider  aspirin 325 MG tablet Take 325 mg by mouth every 6 (six) hours as needed for headache.   Yes Historical Provider, MD  HYDROcodone-acetaminophen (NORCO/VICODIN) 5-325 MG per tablet Take 1 tablet by mouth every 6 (six) hours as needed for moderate pain.   Yes Historical Provider, MD  ibuprofen (ADVIL,MOTRIN) 200 MG tablet Take 400 mg by mouth every 8 (eight) hours as needed for moderate pain.   Yes Historical Provider, MD    ALLERGIES:  No Known Allergies  SOCIAL HISTORY:  History  Substance Use Topics  . Smoking status: Never Smoker   . Smokeless tobacco: Not on file  . Alcohol Use: No    FAMILY HISTORY: History reviewed. No pertinent family history.  EXAM: BP 142/95  Pulse 65  Temp(Src) 98.7 F (37.1 C) (Oral)  Resp 20  SpO2 99% CONSTITUTIONAL: Alert and oriented and responds appropriately to questions. Well-appearing;  well-nourished HEAD: Normocephalic EYES: Conjunctivae clear, PERRL ENT: normal nose; no rhinorrhea; moist mucous membranes; pharynx without lesions noted NECK: Supple, no meningismus, no LAD  CARD: RRR; S1 and S2 appreciated; no murmurs, no clicks, no rubs, no gallops RESP: Normal chest excursion without splinting or tachypnea; breath sounds clear and equal bilaterally; no wheezes, no rhonchi, no rales,  ABD/GI: Normal bowel sounds; non-distended; soft, non-tender, no rebound, no guarding BACK:  The back appears normal and is non-tender to palpation, there is no CVA tenderness EXT: Normal ROM in all joints; non-tender to palpation; no edema; normal capillary refill; no cyanosis    SKIN: Normal color for age and race; warm NEURO: Moves all extremities equally, strength 5/5 in all 4 extremities, sensation to light touch intact diffusely, cranial nerves II through XII intact PSYCH: The patient's mood and manner are appropriate. Grooming and personal hygiene are appropriate.  MEDICAL DECISION MAKING: Patient here with migraine headache. She's had migraines in the past and states this was similar. She is neurologically intact. I do not feel she needs head imaging at this time. We'll give Toradol, Reglan, Benadryl, IV fluids and Decadron and reassess.  ED PROGRESS: Headache is completely gone after migraine cocktail. We'll discharge with prescription for Fioricet to use at home as needed. Have discussed return precautions with patient and daughter. They verbalize understanding and are comfortable with plan.     Layla Maw Ward, DO 08/26/14 [phone]

## 2014-08-26 NOTE — ED Notes (Signed)
C/o HA. Whole head and ears felt like they would "pop".  H/o similar and same. Recurrent. Has seen Dr. Andi Devon [facility]) for same. Has not seen specialist. Saw eye physician yesterday. HA comes and goes. Mentions had episode on Thursday. Took vicodin on Thursday (had from previous back pain). Woke me up tonight. Rates 9/10. LMP 9/12. Also admits when asked positive subjective vague sx of: nv x1 (scant), ear pressure, chills, neck pain, transient sob & transient tachycardia. (denies: diarrhea, fever, sore throat, blurred vision or dizziness). Alert, NAD, calm, interactive, resps e/u, speaking in clear complete sentences, skin W&D.

## 2014-08-26 NOTE — ED Notes (Signed)
Pt in c/o headache with n/v for the last few days, states she has a history of migraines but that she hasn't had one in a long time, pt is concerned that she has hypertension, no history of same.

## 2014-08-26 NOTE — ED Notes (Signed)
Dr. Ward at [facility].

## 2014-08-26 NOTE — ED Notes (Addendum)
Denies HA, abd pain or nausea, "feels better", alert, NAD, calm, resting comfortably. Family remains at [facility].

## 2014-08-26 NOTE — Discharge Instructions (Signed)
Cefalea migrañosa °(Migraine Headache) °Una cefalea migrañosa es un dolor intenso y punzante en uno o ambos lados de la cabeza. La migraña puede durar desde 30 minutos hasta varias horas. °CAUSAS  °No siempre se conoce la causa exacta de la cefalea migrañosa. Sin embargo, pueden aparecer cuando los nervios del cerebro se irritan y liberan ciertas sustancias químicas que causan inflamación. Esto ocasiona dolor. °Existen también ciertos factores que pueden desencadenar las migrañas, como los siguientes: °· Alcohol. °· Fumar. °· Estrés. °· La menstruación °· Quesos añejados. °· Los alimentos o las bebidas que contienen nitratos, glutamato, aspartamo o tiramina. °· Falta de sueño. °· Chocolate. °· Cafeína. °· Hambre. °· Actividad física extenuante. °· Fatiga. °· Medicamentos que se usan para tratar el dolor en el pecho (nitroglicerina), píldoras anticonceptivas, estrógeno y algunos medicamentos para la hipertensión arterial. °SIGNOS Y SÍNTOMAS °· Dolor en uno o ambos lados de la cabeza. °· Dolor pulsante o punzante. °· Dolor intenso que impide realizar las actividades diarias. °· Dolor que se agrava por cualquier actividad física. °· Náuseas, vómitos o ambos. °· Mareos. °· Dolor con la exposición a las luces brillantes, a los ruidos fuertes o la actividad. °· Sensibilidad general a las luces brillantes, a los ruidos fuertes o a los olores. °Antes de sufrir una migraña, puede recibir señales de advertencia (aura). Un aura puede incluir: °· Ver las luces intermitentes. °· Ver puntos brillantes, halos o líneas en zigzag. °· Tener una visión en túnel o visión borrosa. °· Sensación de entumecimiento u hormigueo. °· Dificultad para hablar °· Debilidad muscular. °DIAGNÓSTICO  °La cefalea migrañosa se diagnostica en función de lo siguiente: °· Síntomas. °· Examen físico. °· Una TC (tomografía computada) o resonancia magnética de la cabeza. Estas pruebas de diagnóstico por imagen no pueden diagnosticar las migrañas, pero pueden  ayudar a descartar otras causas de las cefaleas. °TRATAMIENTO °Le prescribirán medicamentos para aliviar el dolor y las náuseas. También podrán administrarse medicamentos para ayudar a prevenir las migrañas recurrentes.  °INSTRUCCIONES PARA EL CUIDADO EN EL HOGAR °· Sólo tome medicamentos de venta libre o recetados para calmar el dolor o el malestar, según las indicaciones de su médico. No se recomienda usar los opiáceos a largo plazo. °· Cuando tenga la migraña, acuéstese en un cuarto oscuro y tranquilo °· Lleve un registro diario para averiguar lo que puede provocar las cefaleas migrañosas. Por ejemplo, escriba: °¨ Lo que usted come y bebe. °¨ Cuánto tiempo duerme. °¨ Algún cambio en su dieta o en los medicamentos. °· Limite el consumo de bebidas alcohólicas. °· Si fuma, deje de hacerlo. °· Duerma entre 7 y 9 horas, o según las recomendaciones del médico. °· Limite el estrés. °· Mantenga las luces tenues si le molestan las luces brillantes y la migraña empeora. °SOLICITE ATENCIÓN MÉDICA DE INMEDIATO SI:  °· La migraña se hace cada vez más intensa. °· Tiene fiebre. °· Presenta rigidez en el cuello. °· Tiene pérdida de visión. °· Presenta debilidad muscular o pérdida del control muscular. °· Comienza a perder el equilibrio o tiene problemas para caminar. °· Sufre mareos o se desmaya. °· Tiene síntomas graves que son diferentes a los primeros síntomas. °ASEGÚRESE DE QUE:  °· Comprende estas instrucciones. °· Controlará su afección. °· Recibirá ayuda de inmediato si no mejora o si empeora. °Document Released: 11/16/2005 Document Revised: 09/06/2013 °[employer]® Patient Information ©2015 [employer]. This information is not intended to replace advice given to you by your health care provider. Make sure you discuss any questions you have with your   health care provider. ° ° °

## 2014-10-15 ENCOUNTER — Other Ambulatory Visit (HOSPITAL_COMMUNITY): Payer: Self-pay | Admitting: Internal Medicine

## 2014-10-15 DIAGNOSIS — Z1231 Encounter for screening mammogram for malignant neoplasm of breast: Secondary | ICD-10-CM

## 2014-11-05 ENCOUNTER — Ambulatory Visit (HOSPITAL_COMMUNITY)
Admission: RE | Admit: 2014-11-05 | Discharge: 2014-11-05 | Disposition: A | Discharge status: Home / Self Care | Payer: BC Managed Care – PPO | Source: Ambulatory Visit | Attending: Internal Medicine | Admitting: Internal Medicine

## 2014-11-05 DIAGNOSIS — Z1231 Encounter for screening mammogram for malignant neoplasm of breast: Secondary | ICD-10-CM | POA: Insufficient documentation

## 2014-12-18 ENCOUNTER — Emergency Department (HOSPITAL_COMMUNITY): Payer: No Typology Code available for payment source

## 2014-12-18 ENCOUNTER — Encounter (HOSPITAL_COMMUNITY): Payer: Self-pay

## 2014-12-18 ENCOUNTER — Emergency Department (HOSPITAL_COMMUNITY)
Admission: EM | Admit: 2014-12-18 | Discharge: 2014-12-18 | Disposition: A | Discharge status: Home / Self Care | Payer: No Typology Code available for payment source | Attending: Emergency Medicine | Admitting: Emergency Medicine

## 2014-12-18 DIAGNOSIS — S29091A Other injury of muscle and tendon of front wall of thorax, initial encounter: Secondary | ICD-10-CM | POA: Diagnosis not present

## 2014-12-18 DIAGNOSIS — Y9389 Activity, other specified: Secondary | ICD-10-CM | POA: Diagnosis not present

## 2014-12-18 DIAGNOSIS — R0789 Other chest pain: Secondary | ICD-10-CM

## 2014-12-18 DIAGNOSIS — Y92410 Unspecified street and highway as the place of occurrence of the external cause: Secondary | ICD-10-CM | POA: Diagnosis not present

## 2014-12-18 DIAGNOSIS — S199XXA Unspecified injury of neck, initial encounter: Secondary | ICD-10-CM | POA: Diagnosis not present

## 2014-12-18 DIAGNOSIS — Z79899 Other long term (current) drug therapy: Secondary | ICD-10-CM | POA: Diagnosis not present

## 2014-12-18 DIAGNOSIS — S79922A Unspecified injury of left thigh, initial encounter: Secondary | ICD-10-CM | POA: Insufficient documentation

## 2014-12-18 DIAGNOSIS — Y998 Other external cause status: Secondary | ICD-10-CM | POA: Diagnosis not present

## 2014-12-18 DIAGNOSIS — M542 Cervicalgia: Secondary | ICD-10-CM

## 2014-12-18 MED ORDER — IBUPROFEN 200 MG PO TABS
600.0000 mg | ORAL_TABLET | Freq: Once | ORAL | Status: AC
  Administered 2014-12-18: 600 mg via ORAL
  Filled 2014-12-18: qty 3

## 2014-12-18 MED ORDER — ACETAMINOPHEN 325 MG PO TABS
650.0000 mg | ORAL_TABLET | Freq: Once | ORAL | Status: DC
Start: 2014-12-18 — End: 2014-12-18
  Filled 2014-12-18: qty 2

## 2014-12-18 MED ORDER — IBUPROFEN 800 MG PO TABS: 800.0000 mg | ORAL_TABLET | Freq: Once | ORAL | Status: DC
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
## 2014-12-18 NOTE — Discharge Instructions (Signed)
If you were given medicines take as directed.  If you are on coumadin or contraceptives realize their levels and effectiveness is altered by many different medicines.  If you have any reaction (rash, tongues swelling, other) to the medicines stop taking and see a physician.   Ibuprofen and tylenol for pain.  Please follow up as directed and return to the ER or see a physician for new or worsening symptoms.  Thank you. Filed Vitals:   12/18/14 0941  BP: 175/93  Pulse: 79  Temp: 97.6 F (36.4 C)  TempSrc: Oral  Resp: 18  SpO2: 99%

## 2016-06-25 ENCOUNTER — Emergency Department (HOSPITAL_COMMUNITY): Payer: BLUE CROSS/BLUE SHIELD

## 2016-06-25 ENCOUNTER — Encounter (HOSPITAL_COMMUNITY): Payer: Self-pay | Admitting: *Deleted

## 2016-06-25 ENCOUNTER — Observation Stay (HOSPITAL_COMMUNITY)
Admission: EM | Admit: 2016-06-25 | Discharge: 2016-06-26 | Disposition: A | Discharge status: Home / Self Care | Payer: BLUE CROSS/BLUE SHIELD | Attending: Internal Medicine | Admitting: Internal Medicine

## 2016-06-25 DIAGNOSIS — M545 Low back pain, unspecified: Secondary | ICD-10-CM | POA: Diagnosis present

## 2016-06-25 DIAGNOSIS — J309 Allergic rhinitis, unspecified: Secondary | ICD-10-CM | POA: Diagnosis not present

## 2016-06-25 DIAGNOSIS — G43909 Migraine, unspecified, not intractable, without status migrainosus: Secondary | ICD-10-CM | POA: Diagnosis not present

## 2016-06-25 DIAGNOSIS — M47816 Spondylosis without myelopathy or radiculopathy, lumbar region: Secondary | ICD-10-CM | POA: Diagnosis not present

## 2016-06-25 DIAGNOSIS — E876 Hypokalemia: Secondary | ICD-10-CM | POA: Diagnosis not present

## 2016-06-25 DIAGNOSIS — G8929 Other chronic pain: Secondary | ICD-10-CM | POA: Insufficient documentation

## 2016-06-25 DIAGNOSIS — R519 Headache, unspecified: Secondary | ICD-10-CM | POA: Diagnosis present

## 2016-06-25 DIAGNOSIS — R9389 Abnormal findings on diagnostic imaging of other specified body structures: Secondary | ICD-10-CM | POA: Diagnosis present

## 2016-06-25 DIAGNOSIS — R938 Abnormal findings on diagnostic imaging of other specified body structures: Secondary | ICD-10-CM

## 2016-06-25 DIAGNOSIS — R51 Headache: Secondary | ICD-10-CM | POA: Diagnosis not present

## 2016-06-25 DIAGNOSIS — R001 Bradycardia, unspecified: Secondary | ICD-10-CM | POA: Diagnosis not present

## 2016-06-25 DIAGNOSIS — R531 Weakness: Secondary | ICD-10-CM | POA: Diagnosis not present

## 2016-06-25 DIAGNOSIS — R270 Ataxia, unspecified: Secondary | ICD-10-CM

## 2016-06-25 DIAGNOSIS — R29898 Other symptoms and signs involving the musculoskeletal system: Secondary | ICD-10-CM | POA: Diagnosis present

## 2016-06-25 DIAGNOSIS — M5440 Lumbago with sciatica, unspecified side: Secondary | ICD-10-CM

## 2016-06-25 DIAGNOSIS — R269 Unspecified abnormalities of gait and mobility: Secondary | ICD-10-CM

## 2016-06-25 HISTORY — DX: Ataxia, unspecified: R27.0

## 2016-06-25 HISTORY — DX: Dorsalgia, unspecified: M54.9

## 2016-06-25 HISTORY — DX: Other chronic pain: G89.29

## 2016-06-25 LAB — URINE MICROSCOPIC-ADD ON

## 2016-06-25 LAB — BASIC METABOLIC PANEL
ANION GAP: 9 (ref 5–15)
BUN: 9 mg/dL (ref 6–20)
CALCIUM: 9.1 mg/dL (ref 8.9–10.3)
CO2: 25 mmol/L (ref 22–32)
Chloride: 105 mmol/L (ref 101–111)
Creatinine, Ser: 0.61 mg/dL (ref 0.44–1.00)
GFR calc non Af Amer: >60 mL/min (ref >60)
Glucose, Bld: 100 mg/dL — ABNORMAL HIGH (ref 65–99)
Potassium: 3.1 mmol/L — ABNORMAL LOW (ref 3.5–5.1)
SODIUM: 139 mmol/L (ref 135–145)

## 2016-06-25 LAB — RAPID URINE DRUG SCREEN, HOSP PERFORMED
Amphetamines: NOT DETECTED
BARBITURATES: NOT DETECTED
Benzodiazepines: NOT DETECTED
Cocaine: NOT DETECTED
Opiates: NOT DETECTED
TETRAHYDROCANNABINOL: NOT DETECTED

## 2016-06-25 LAB — URINALYSIS, ROUTINE W REFLEX MICROSCOPIC
BILIRUBIN URINE: NEGATIVE
GLUCOSE, UA: NEGATIVE mg/dL
KETONES UR: NEGATIVE mg/dL
Nitrite: NEGATIVE
PH: 7.5 (ref 5.0–8.0)
Protein, ur: NEGATIVE mg/dL
SPECIFIC GRAVITY, URINE: 1.012 (ref 1.005–1.030)

## 2016-06-25 LAB — CK: Total CK: 76 U/L (ref 38–234)

## 2016-06-25 LAB — POC URINE PREG, ED: Preg Test, Ur: NEGATIVE

## 2016-06-25 LAB — CBC
HCT: 38.4 % (ref 36.0–46.0)
HEMOGLOBIN: 12.8 g/dL (ref 12.0–15.0)
MCH: 30.8 pg (ref 26.0–34.0)
MCHC: 33.3 g/dL (ref 30.0–36.0)
MCV: 92.3 fL (ref 78.0–100.0)
Platelets: 222 10*3/uL (ref 150–400)
RBC: 4.16 MIL/uL (ref 3.87–5.11)
RDW: 13.5 % (ref 11.5–15.5)
WBC: 3.7 10*3/uL — ABNORMAL LOW (ref 4.0–10.5)

## 2016-06-25 LAB — I-STAT TROPONIN, ED: Troponin i, poc: 0 ng/mL (ref 0.00–0.08)

## 2016-06-25 LAB — TSH: TSH: 1.778 u[IU]/mL (ref 0.350–4.500)

## 2016-06-25 LAB — ETHANOL

## 2016-06-25 MED ORDER — LORATADINE 10 MG PO TABS
10.0000 mg | ORAL_TABLET | Freq: Every day | ORAL | Status: DC
  Administered 2016-06-25 – 2016-06-26 (×2): 10 mg via ORAL
  Filled 2016-06-25 (×2): qty 1

## 2016-06-25 MED ORDER — SODIUM CHLORIDE 0.9 % IV BOLUS (SEPSIS)
1000.0000 mL | Freq: Once | INTRAVENOUS | Status: AC
  Administered 2016-06-25: 1000 mL via INTRAVENOUS

## 2016-06-25 MED ORDER — HYDROCODONE-ACETAMINOPHEN 5-325 MG PO TABS
1.0000 | ORAL_TABLET | ORAL | Status: DC | PRN
  Administered 2016-06-26: 1 via ORAL
  Filled 2016-06-25: qty 1

## 2016-06-25 MED ORDER — LORAZEPAM 1 MG PO TABS: 1.0000 mg | ORAL_TABLET | Freq: Once | ORAL | Status: DC

## 2016-06-25 MED ORDER — ENOXAPARIN SODIUM 40 MG/0.4ML ~~LOC~~ SOLN: 40.0000 mg | SUBCUTANEOUS | Status: DC

## 2016-06-25 MED ORDER — ONDANSETRON HCL 4 MG PO TABS: 4.0000 mg | ORAL_TABLET | Freq: Four times a day (QID) | ORAL | Status: DC | PRN

## 2016-06-25 MED ORDER — GADOBENATE DIMEGLUMINE 529 MG/ML IV SOLN
10.0000 mL | Freq: Once | INTRAVENOUS | Status: AC | PRN
  Administered 2016-06-25: 10 mL via INTRAVENOUS

## 2016-06-25 MED ORDER — ACETAMINOPHEN 325 MG PO TABS: 650.0000 mg | ORAL_TABLET | Freq: Four times a day (QID) | ORAL | Status: DC | PRN

## 2016-06-25 MED ORDER — POTASSIUM CHLORIDE CRYS ER 20 MEQ PO TBCR
40.0000 meq | EXTENDED_RELEASE_TABLET | Freq: Two times a day (BID) | ORAL | Status: AC
  Administered 2016-06-25 – 2016-06-26 (×2): 40 meq via ORAL
  Filled 2016-06-25 (×2): qty 2

## 2016-06-25 MED ORDER — ACETAMINOPHEN 650 MG RE SUPP: 650.0000 mg | Freq: Four times a day (QID) | RECTAL | Status: DC | PRN

## 2016-06-25 MED ORDER — SODIUM CHLORIDE 0.9 % IV SOLN
INTRAVENOUS | Status: AC
  Administered 2016-06-25: 1000 mL via INTRAVENOUS

## 2016-06-25 MED ORDER — ONDANSETRON HCL 4 MG/2ML IJ SOLN: 4.0000 mg | Freq: Four times a day (QID) | INTRAMUSCULAR | Status: DC | PRN

## 2016-06-25 MED ORDER — MORPHINE SULFATE (PF) 2 MG/ML IV SOLN: 1.0000 mg | INTRAVENOUS | Status: DC | PRN

## 2016-06-25 MED ORDER — LORAZEPAM 1 MG PO TABS
1.0000 mg | ORAL_TABLET | Freq: Once | ORAL | Status: AC
  Administered 2016-06-25: 1 mg via ORAL
  Filled 2016-06-25: qty 1

## 2016-06-25 MED ORDER — ASPIRIN EC 325 MG PO TBEC
325.0000 mg | DELAYED_RELEASE_TABLET | Freq: Once | ORAL | Status: AC
  Administered 2016-06-25: 325 mg via ORAL
  Filled 2016-06-25: qty 1

## 2016-06-25 NOTE — ED Notes (Signed)
PT still in CT/MRI

## 2016-06-25 NOTE — ED Notes (Signed)
Pt denies complaints of headache at this time. Only complaint at this time is her lower leg weakness. On assessment, pts legs are strong against resistance.

## 2016-06-25 NOTE — H&P (Signed)
History and Physical    Vanessa Beltran RUE:[id number] DOB: 12/29/1960 DOA: 06/25/2016  PCP: Lavell Islam, MD Patient coming from: home  Chief Complaint: difficulty walking  HPI: Vanessa Beltran is a 55 y.o. female with medical history significant for chronic low back pain and neck pain presents to the emergency Department chief complaint of bilateral lower extremity weakness and gait changes. Initial evaluation mostly unremarkable but being admitted for observation overnight to have MRI of the brain in the morning and while gait improve still unsteady.  Information is obtained from the patient through her daughter who serves as interpreter and the chart. She reports about 8:00 last evening she developed lower extremity weakness and difficulty walking. She reports she was concerned about a stroke as she has family members who have had strokes and they had similar symptoms. She reports the weakness started in the left leg than the right leg developed weakness as well. Weakness is worse today. Associated symptoms include worsening of her chronic back pain. She took Tylenol and she reports this improved her back pain. In addition she experienced nausea and migraine yesterday during the day. She denies any numbness or tingling of her lower extremities. She denies any loss of control of bowel or bladder. She denies any falls or recent trauma. She denies visual disturbances difficulty speaking or swallowing. She denies numbness tingling or weakness of her upper extremities. 4 to generalized fatigue. She denies fever chills dysuria hematuria frequency or urgency. She denies constipation diarrhea melena or bright red blood per rectum    ED Course: In emergency department she's provided with pain medicine Zofran IV fluids. She is afebrile hemodynamically stable and not hypoxic.  Review of Systems: As per HPI otherwise 10 point review of systems negative.   Ambulatory Status: She ambulates independently  with steady gait normally. No recent falls  Past Medical History:  Diagnosis Date  . Ataxia   . Chronic back pain   . Medical history non-contributory     Past Surgical History:  Procedure Laterality Date  . CESAREAN SECTION    . CYSTOSCOPY N/A 10/18/2013   Procedure: CYSTOSCOPY;  Surgeon: Mitchel Honour, DO;  Location: [facility];  Service: Gynecology;  Laterality: N/A;  . LAPAROSCOPIC BILATERAL SALPINGECTOMY Right 10/18/2013   Procedure:  LAPAROSCOPIC RIGHT SALPINGECTOMY;  Surgeon: Mitchel Honour, DO;  Location: [facility];  Service: Gynecology;  Laterality: Right;  . LAPAROSCOPIC LYSIS OF ADHESIONS N/A 10/18/2013   Procedure: LAPAROSCOPIC EXTENSIVE LYSIS OF ADHESIONS;  Surgeon: Mitchel Honour, DO;  Location: [facility];  Service: Gynecology;  Laterality: N/A;  . LAPAROSCOPIC OVARIAN CYSTECTOMY    . LAPAROTOMY Left 10/18/2013   Procedure: LAPAROTOMY, LEFT SALPINGO-OOPHORECTOMY, LYSIS OF ADHESIONS;  Surgeon: Mitchel Honour, DO;  Location: [facility];  Service: Gynecology;  Laterality: Left;  . TUBAL LIGATION      Social History   Social History  . Marital status: Married    Spouse name: N/A  . Number of children: N/A  . Years of education: N/A   Occupational History  . Not on file.   Social History Main Topics  . Smoking status: Never Smoker  . Smokeless tobacco: Not on file  . Alcohol use No  . Drug use: No  . Sexual activity: Not on file   Other Topics Concern  . Not on file   Social History Narrative  . No narrative on file    No Known Allergies  History reviewed. No pertinent family history.  Prior to Admission medications   Medication Sig  Start Date End Date Taking? Authorizing Provider  cetirizine (ZYRTEC) 10 MG tablet Take 10 mg by mouth at bedtime.    Historical Provider, MD    Physical Exam: Vitals:   06/25/16 1600 06/25/16 1615 06/25/16 1622 06/25/16 1630  BP: 129/78 141/91 147/80 141/83  Pulse: 65 63 69 68  Resp: 23 16 20 21   Temp:      TempSrc:      SpO2: 98%  99% 98% 99%     General:  Appears calm and comfortable, smiling in no acute distress Eyes:  PERRL, EOMI, normal lids, iris ENT:  grossly normal hearing, lips & tongue, mucous membranes of her mouth are pink only slightly dry Neck:  no LAD, masses or thyromegaly, nontender to palpation for range of motion Cardiovascular:  RRR, no m/r/g. No LE edema. Pedal pulses present and palpable Respiratory:  CTA bilaterally, no w/r/r. Normal respiratory effort. Abdomen:  soft, ntnd, positive bowel sounds no guarding or rebounding Skin:  no rash or induration seen on limited exam Musculoskeletal:  grossly normal tone BUE/BLE, good ROM, no bony abnormality, joints without swelling/erythema Psychiatric:  grossly normal mood and affect, speech fluent and appropriate, AOx3, bilateral lower extremity strength 5 out of 5. She ambulates in the department with slightly unsteady gait mostly when she is looking down. Needs to be reminded to look ahead Neurologic:  CN 2-12 grossly intact, moves all extremities in coordinated fashion, sensation intact  Labs on Admission: I have personally reviewed following labs and imaging studies  CBC:  Recent Labs Lab 06/25/16 0855  WBC 3.7*  HGB 12.8  HCT 38.4  MCV 92.3  PLT 222   Basic Metabolic Panel:  Recent Labs Lab 06/25/16 0855  NA 139  K 3.1*  CL 105  CO2 25  GLUCOSE 100*  BUN 9  CREATININE 0.61  CALCIUM 9.1   GFR: CrCl cannot be calculated (Unknown ideal weight.). Liver Function Tests: No results for input(s): AST, ALT, ALKPHOS, BILITOT, PROT, ALBUMIN in the last 168 hours. No results for input(s): LIPASE, AMYLASE in the last 168 hours. No results for input(s): AMMONIA in the last 168 hours. Coagulation Profile: No results for input(s): INR, PROTIME in the last 168 hours. Cardiac Enzymes:  Recent Labs Lab 06/25/16 0855  CKTOTAL 76   BNP (last 3 results) No results for input(s): PROBNP in the last 8760 hours. HbA1C: No results for  input(s): HGBA1C in the last 72 hours. CBG: No results for input(s): GLUCAP in the last 168 hours. Lipid Profile: No results for input(s): CHOL, HDL, LDLCALC, TRIG, CHOLHDL, LDLDIRECT in the last 72 hours. Thyroid Function Tests: No results for input(s): TSH, T4TOTAL, FREET4, T3FREE, THYROIDAB in the last 72 hours. Anemia Panel: No results for input(s): VITAMINB12, FOLATE, FERRITIN, TIBC, IRON, RETICCTPCT in the last 72 hours. Urine analysis:    Component Value Date/Time   COLORURINE YELLOW 02/12/2010 0240   APPEARANCEUR CLOUDY (A) 02/12/2010 0240   LABSPEC 1.023 02/12/2010 0240   PHURINE 7.5 02/12/2010 0240   GLUCOSEU NEGATIVE 02/12/2010 0240   HGBUR SMALL (A) 02/12/2010 0240   BILIRUBINUR NEGATIVE 02/12/2010 0240   KETONESUR 15 (A) 02/12/2010 0240   PROTEINUR 100 (A) 02/12/2010 0240   UROBILINOGEN 1.0 02/12/2010 0240   NITRITE NEGATIVE 02/12/2010 0240   LEUKOCYTESUR NEGATIVE 02/12/2010 0240    Creatinine Clearance: CrCl cannot be calculated (Unknown ideal weight.).  Sepsis Labs: @LABRCNTIP (procalcitonin:4,lacticidven:4) )No results found for this or any previous visit (from the past 240 hour(s)).   Radiological Exams on Admission:  Dg Chest 2 View  Result Date: 06/25/2016 CLINICAL DATA:  Chest pain for week. EXAM: CHEST  2 VIEW COMPARISON:  Radiographs of December 18, 2014. FINDINGS: The heart size and mediastinal contours are within normal limits. Both lungs are clear. No pneumothorax or pleural effusion is noted. The visualized skeletal structures are unremarkable. IMPRESSION: No active cardiopulmonary disease. Electronically Signed   By: Lupita Raider, M.D.   On: 06/25/2016 10:06  Ct Head Wo Contrast  Result Date: 06/25/2016 CLINICAL DATA:  Posterior headache, 1 day duration.  Dizziness. EXAM: CT HEAD WITHOUT CONTRAST TECHNIQUE: Contiguous axial images were obtained from the base of the skull through the vertex without intravenous contrast. COMPARISON:  None. FINDINGS:  The brain has a normal appearance without evidence of malformation, atrophy, old or acute infarction, mass lesion, hemorrhage, hydrocephalus or extra-axial collection. The calvarium is unremarkable. The paranasal sinuses, middle ears and mastoids are clear. IMPRESSION: Normal Electronically Signed   By: Paulina Fusi M.D.   On: 06/25/2016 11:23  Mr Lumbar Spine W Wo Contrast  Result Date: 06/25/2016 CLINICAL DATA:  Lower extremity weakness involving the left greater than right legs for 2 days. Headache. EXAM: MRI LUMBAR SPINE WITHOUT AND WITH CONTRAST TECHNIQUE: Multiplanar and multiecho pulse sequences of the lumbar spine were obtained without and with intravenous contrast. CONTRAST:  10mL MULTIHANCE GADOBENATE DIMEGLUMINE 529 MG/ML IV SOLN COMPARISON:  Pelvic MRI 09/05/2013. CT abdomen and pelvis 02/12/2010. FINDINGS: Segmentation:  Normal lumbar segmentation demonstrated on prior CT. Alignment:  Normal. Vertebrae: Preserved vertebral body heights without evidence of fracture. No suspicious osseous lesion or significant vertebral marrow edema identified. Disc desiccation from L1-2 to L4-5. Conus medullaris: Extends to the L1 level and appears normal. Paraspinal and other soft tissues: 7 mm T2 hyperintense lesion in the interpolar right kidney, likely a cyst. Partially visualized uterine fibroids measuring up to 1.8 cm in size, similar to the prior MRI. Partially visualized cystic or fluid-filled tubular structure in the right adnexa measuring at least 4.2 cm in size, not present on the prior MRI. Disc levels: L1-2: Minimal disc bulging without stenosis. L2-3:  Minimal disc bulging without stenosis. L3-4: Minimal disc bulging, small left paracentral disc protrusion with annular fissure, and mild facet arthrosis without stenosis. L4-5: Minimal disc bulging into the neural foramina and mild facet hypertrophy without stenosis. L5-S1:  Mild facet arthrosis without disc herniation or stenosis. IMPRESSION: 1. Mild  lumbar spondylosis and facet arthrosis without evidence of neural impingement. 2. Partially visualized 4 cm cystic structure in the right adnexa, not present on the prior MRI and indeterminate. This could reflect an adnexal cyst, hydrosalpinx, or fluid-filled small bowel loop. Non-urgent pelvic ultrasound could be performed for further evaluation. Electronically Signed   By: Sebastian Ache M.D.   On: 06/25/2016 12:46   EKG: Independently reviewed. Sinus bradycardia  Assessment/Plan Principal Problem:   Ataxia Active Problems:   Allergic rhinitis   LOW BACK PAIN, CHRONIC   Leg weakness, bilateral   Migraine   Headache   Hypokalemia   Bradycardia   #1. Ataxia. Presented to the emergency department with difficult gait bilateral lower extremity weakness. MRI of the lumbar spine revealed mild degenerative changes otherwise unremarkable. CT of the head revealed no significant changes. 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 Neuro note indicates also in the differential but less likely would be possibility of posterior circulation ischemic event. He recommended MRI of the brain and if negative and patient able to walk  discharge. MRI the brain unable to be done today as patient had contrast earlier today. Gait is much improved but still slightly wobbly -Admit to MedSurg -Gentle IV fluids -Neuro checks -Ambulate with assistance -Minimize sedating meds -Urine drug screen -EtOH  #2. Bradycardia. Mild. EKG as noted above. Troponin negative. No home medications -Obtain a TSH -Repeat EKG in the morning -Blood pressure every 42  #3. Headache. Resolved admission. Patient with a history of migraines. See #1. CT of the head revealed no significant changes Neuro exam benign -MRI of the brain tomorrow -Neuro checks  #4. Hypokalemia. Mild. Likely related to decreased oral intake. -replete -recheck in am  #5. Chronic low back pain.  Worsening prior to presentation. No reported loss of bowel or bladder function. Mild lumbar spine revealed mild degenerative changes otherwise unremarkable. Back pain resolved at point of admission -Ambulate with assistance -Physical therapy  6. Abnormal MRI. As noted above MRI lumbar spine completed and revealed Partially visualized 4 cm cystic structure in the right adnexa, not present on the prior MRI and indeterminate. Report indicates this could reflect an adnexal cyst, hydrosalpinx, or fluid-filled small bowel loop. Non-urgent pelvic ultrasound could be performed for further Evaluation. -Recommend outpatient follow-up    DVT prophylaxis: scd Code Status: full  Family Communication: daughter at bedside  Disposition Plan: home after MRI tomorrow  Consults called: neuro  Admission status: obs    Gwenyth Bender MD [facility]  If 7PM-7AM, please contact night-coverage [email or web address] Password [facility]  06/25/2016, 4:33 PM

## 2016-06-25 NOTE — ED Notes (Signed)
Pt going to MRI from CT 

## 2016-06-25 NOTE — ED Notes (Signed)
Neuro at bedside.

## 2016-06-25 NOTE — ED Triage Notes (Signed)
Pt reports headache since yesterday, hx of migraines. Having nausea yesterday. Reports back pain that was radiating to left leg yesterday. Now feels like both legs are weak today.

## 2016-06-25 NOTE — Progress Notes (Signed)
Patient trasfered from ED to [phone] via bed; alert and oriented x 4; no complaints of pain; IV saline locked in RAC; fluids started - [place]@50  cc/hr; skin intact. Orient patient to room and unit;  gave patient care guide; instructed how to use the call bell and  fall risk precautions. Will continue to monitor the patient.

## 2016-06-25 NOTE — ED Provider Notes (Signed)
[facility]-EMERGENCY DEPT Provider Note   CSN: [id number] Arrival date & time: 06/25/16  7829  First Provider Contact:  First MD Initiated Contact with Patient 06/25/16 [phone]        History   Chief Complaint Chief Complaint  Patient presents with  . Migraine  . Back Pain    HPI Vanessa Beltran is a 55 y.o. female.  55 year old female with history of chronic low back and neck pain presenting today for lower extremity weakness and gait changes that started last night at approximately 8 PM. Her primary concern is for stroke as she has multiple family members who have had a stroke present with similar symptoms. The weakness started in the left leg and is worse today on the right. She had back pain associated with it last night but currently has no back pain. She took a Tylenol last night at approximately 9 PM that improved her back pain. She denies any numbness, bladder incontinence, or bowel incontinence. She has not had any recent trauma. No fevers, chills, cough, vomiting. She did have nausea associated with a migraine during the day yesterday. She says everyday tasks such as walking around or picking something up off the floor create a greater sense of fatigue than usual with heavy breathing.   The history is provided by the patient and a relative. The history is limited by a language barrier. A language interpreter was used.   Associated symptoms include fatigue, headaches, nausea, neck pain and weakness. Pertinent negatives include no abdominal pain, arthralgias, chest pain, chills, coughing, fever, numbness, rash, sore throat or vomiting.  Back Pain  Associated symptoms include headaches and weakness. Pertinent negatives include no chest pain, no fever, no numbness, no abdominal pain and no dysuria.    Past Medical History:  Diagnosis Date  . Ataxia   . Chronic back pain   . Medical history non-contributory     Patient Active Problem List   Diagnosis Date Noted  . Leg weakness,  bilateral 06/25/2016  . Migraine 06/25/2016  . Headache 06/25/2016  . Hypokalemia 06/25/2016  . Bradycardia 06/25/2016  . Ataxia   . HYDROSALPINX 03/04/2010  . ABDOMINAL PAIN, LOWER 03/04/2010  . Allergic rhinitis 11/12/2009  . NECK PAIN 10/29/2009  . LOW BACK PAIN, CHRONIC 10/29/2009    Past Surgical History:  Procedure Laterality Date  . CESAREAN SECTION    . CYSTOSCOPY N/A 10/18/2013   Procedure: CYSTOSCOPY;  Surgeon: Mitchel Honour, DO;  Location: [facility];  Service: Gynecology;  Laterality: N/A;  . LAPAROSCOPIC BILATERAL SALPINGECTOMY Right 10/18/2013   Procedure:  LAPAROSCOPIC RIGHT SALPINGECTOMY;  Surgeon: Mitchel Honour, DO;  Location: [facility];  Service: Gynecology;  Laterality: Right;  . LAPAROSCOPIC LYSIS OF ADHESIONS N/A 10/18/2013   Procedure: LAPAROSCOPIC EXTENSIVE LYSIS OF ADHESIONS;  Surgeon: Mitchel Honour, DO;  Location: [facility];  Service: Gynecology;  Laterality: N/A;  . LAPAROSCOPIC OVARIAN CYSTECTOMY    . LAPAROTOMY Left 10/18/2013   Procedure: LAPAROTOMY, LEFT SALPINGO-OOPHORECTOMY, LYSIS OF ADHESIONS;  Surgeon: Mitchel Honour, DO;  Location: [facility];  Service: Gynecology;  Laterality: Left;  . TUBAL LIGATION      OB History    No data available       Home Medications    Prior to Admission medications   Medication Sig Start Date End Date Taking? Authorizing Provider  cetirizine (ZYRTEC) 10 MG tablet Take 10 mg by mouth at bedtime.    Historical Provider, MD    Family History History reviewed. No pertinent family history.  Social  History Social History  Substance Use Topics  . Smoking status: Never Smoker  . Smokeless tobacco: Not on file  . Alcohol use No     Allergies   Review of patient's allergies indicates no known allergies.   Review of Systems Review of Systems  Constitutional: Positive for fatigue. Negative for chills and fever.  HENT: Negative for ear pain and sore throat.   Eyes: Negative for pain and visual disturbance.  Respiratory:  Negative for cough and shortness of breath.   Cardiovascular: Negative for chest pain and palpitations.  Gastrointestinal: Positive for nausea. Negative for abdominal pain and vomiting.  Genitourinary: Negative for decreased urine volume, difficulty urinating and dysuria.  Musculoskeletal: Positive for back pain, gait problem and neck pain. Negative for arthralgias and neck stiffness.  Skin: Negative for color change and rash.  Neurological: Positive for weakness and headaches. Negative for seizures, syncope, light-headedness and numbness.  Psychiatric/Behavioral: Negative for agitation and confusion.  All other systems reviewed and are negative.    Physical Exam Updated Vital Signs BP 141/83   Pulse 68   Temp 98.5 F (36.9 C) (Oral)   Resp 21   LMP 06/05/2016   SpO2 99%   Physical Exam  Constitutional: She appears well-developed and well-nourished. No distress.  HENT:  Head: Normocephalic and atraumatic.  Eyes: Conjunctivae are normal.  Neck: Neck supple.  Cardiovascular: Normal rate and regular rhythm.   No murmur heard. Pulmonary/Chest: Effort normal and breath sounds normal. No respiratory distress. She has no wheezes. She has no rales.  Abdominal: Soft. She exhibits no mass. There is no tenderness.  Musculoskeletal: She exhibits no edema.  No tenderness to palpation of the cervical, thoracic, or lumbar spine.  Neurological: She is alert. She displays normal reflexes. No cranial nerve deficit. Coordination normal.  Short, mechanical gait. Narrow-based. Negative Romberg. Normal finger to nose and heel to shin bilaterally. Her pronator drift. Normal sensation in upper and lower extremities. Normal sensation in inner thighs.  Skin: Skin is warm and dry.  Psychiatric: She has a normal mood and affect. Her behavior is normal.  Nursing note and vitals reviewed.    ED Treatments / Results  Labs (all labs ordered are listed, but only abnormal results are displayed) Labs  Reviewed  BASIC METABOLIC PANEL - Abnormal; Notable for the following:       Result Value   Potassium 3.1 (*)    Glucose, Bld 100 (*)    All other components within normal limits  CBC - Abnormal; Notable for the following:    WBC 3.7 (*)    All other components within normal limits  CK  TSH  URINALYSIS, ROUTINE W REFLEX MICROSCOPIC (NOT AT [facility])  URINE RAPID DRUG SCREEN, HOSP PERFORMED  ETHANOL  I-STAT TROPOININ, ED  POC URINE PREG, ED    EKG  EKG Interpretation  Date/Time:  Thursday June 25 2016 10:07:53 EDT Ventricular Rate:  58 PR Interval:    QRS Duration: 94 QT Interval:  415 QTC Calculation: 408 R Axis:   51 Text Interpretation:  Sinus bradycardia Otherwise normal ECG Confirmed by DELO  MD, Riley Lam ([id number]) on 06/25/2016 10:27:44 AM Also confirmed by Judd Lien  MD, DOUGLAS ([id number]), editor Dewey, Cala Bradford [phone])  on 06/25/2016 11:55:46 AM       Radiology Dg Chest 2 View  Result Date: 06/25/2016 CLINICAL DATA:  Chest pain for week. EXAM: CHEST  2 VIEW COMPARISON:  Radiographs of December 18, 2014. FINDINGS: The heart size and mediastinal contours are within  normal limits. Both lungs are clear. No pneumothorax or pleural effusion is noted. The visualized skeletal structures are unremarkable. IMPRESSION: No active cardiopulmonary disease. Electronically Signed   By: Lupita Raider, M.D.   On: 06/25/2016 10:06  Ct Head Wo Contrast  Result Date: 06/25/2016 CLINICAL DATA:  Posterior headache, 1 day duration.  Dizziness. EXAM: CT HEAD WITHOUT CONTRAST TECHNIQUE: Contiguous axial images were obtained from the base of the skull through the vertex without intravenous contrast. COMPARISON:  None. FINDINGS: The brain has a normal appearance without evidence of malformation, atrophy, old or acute infarction, mass lesion, hemorrhage, hydrocephalus or extra-axial collection. The calvarium is unremarkable. The paranasal sinuses, middle ears and mastoids are clear. IMPRESSION: Normal  Electronically Signed   By: Paulina Fusi M.D.   On: 06/25/2016 11:23  Mr Lumbar Spine W Wo Contrast  Result Date: 06/25/2016 CLINICAL DATA:  Lower extremity weakness involving the left greater than right legs for 2 days. Headache. EXAM: MRI LUMBAR SPINE WITHOUT AND WITH CONTRAST TECHNIQUE: Multiplanar and multiecho pulse sequences of the lumbar spine were obtained without and with intravenous contrast. CONTRAST:  10mL MULTIHANCE GADOBENATE DIMEGLUMINE 529 MG/ML IV SOLN COMPARISON:  Pelvic MRI 09/05/2013. CT abdomen and pelvis 02/12/2010. FINDINGS: Segmentation:  Normal lumbar segmentation demonstrated on prior CT. Alignment:  Normal. Vertebrae: Preserved vertebral body heights without evidence of fracture. No suspicious osseous lesion or significant vertebral marrow edema identified. Disc desiccation from L1-2 to L4-5. Conus medullaris: Extends to the L1 level and appears normal. Paraspinal and other soft tissues: 7 mm T2 hyperintense lesion in the interpolar right kidney, likely a cyst. Partially visualized uterine fibroids measuring up to 1.8 cm in size, similar to the prior MRI. Partially visualized cystic or fluid-filled tubular structure in the right adnexa measuring at least 4.2 cm in size, not present on the prior MRI. Disc levels: L1-2: Minimal disc bulging without stenosis. L2-3:  Minimal disc bulging without stenosis. L3-4: Minimal disc bulging, small left paracentral disc protrusion with annular fissure, and mild facet arthrosis without stenosis. L4-5: Minimal disc bulging into the neural foramina and mild facet hypertrophy without stenosis. L5-S1:  Mild facet arthrosis without disc herniation or stenosis. IMPRESSION: 1. Mild lumbar spondylosis and facet arthrosis without evidence of neural impingement. 2. Partially visualized 4 cm cystic structure in the right adnexa, not present on the prior MRI and indeterminate. This could reflect an adnexal cyst, hydrosalpinx, or fluid-filled small bowel loop.  Non-urgent pelvic ultrasound could be performed for further evaluation. Electronically Signed   By: Sebastian Ache M.D.   On: 06/25/2016 12:46   Procedures Procedures (including critical care time)  Medications Ordered in ED Medications  loratadine (CLARITIN) tablet 10 mg (10 mg Oral Given 06/25/16 1628)  acetaminophen (TYLENOL) tablet 650 mg (not administered)    Or  acetaminophen (TYLENOL) suppository 650 mg (not administered)  HYDROcodone-acetaminophen (NORCO/VICODIN) 5-325 MG per tablet 1-2 tablet (not administered)  ondansetron (ZOFRAN) tablet 4 mg (not administered)    Or  ondansetron (ZOFRAN) injection 4 mg (not administered)  potassium chloride SA (K-DUR,KLOR-CON) CR tablet 40 mEq (not administered)  0.9 %  sodium chloride infusion (not administered)  LORazepam (ATIVAN) tablet 1 mg (1 mg Oral Given 06/25/16 1051)  gadobenate dimeglumine (MULTIHANCE) injection 10 mL (10 mLs Intravenous Contrast Given 06/25/16 1230)  sodium chloride 0.9 % bolus 1,000 mL (1,000 mLs Intravenous New Bag/Given 06/25/16 1632)  aspirin EC tablet 325 mg (325 mg Oral Given 06/25/16 1629)     Initial Impression / Assessment and  Plan / ED Course  I have reviewed the triage vital signs and the nursing notes.  Pertinent labs & imaging results that were available during my care of the patient were reviewed by me and considered in my medical decision making (see chart for details).  Clinical Course   Patient presents with weakness of the lower extremities that started at 8 PM last night. Neurologic exam nonfocal. On multiple attempts to ambulate, patient demonstrates unsteady gait. MR of the lumbar spine does not suggest an etiology of this gait abnormality. CT of the head unremarkable. Neurology was consult at and is recommended MR of the brain. Given patient had recent contrast or MRI of the lumbar spine and she cannot have an MRI of the brain until tomorrow. She'll be admitted observationally to hospitalist  service for planned MRI of the brain tomorrow.Laboratory evaluation unremarkable. Troponin and chest x-ray ordered for patient's sensation of increased work of breathing when she ambulates or performs everyday tasks. Chest x-ray unremarkable. Troponin undetectable. She was admitted to the hospitalist service in fair condition. Vital signs remained within normal limits in the ED.   Final Clinical Impressions(s) / ED Diagnoses   Final diagnoses:  Lower extremity weakness  Gait abnormality    New Prescriptions New Prescriptions   No medications on file     Levora Angel, MD 06/25/16 1647    Geoffery Lyons, MD 07/01/16 0045

## 2016-06-25 NOTE — Consult Note (Signed)
Initial Neurological Consultation                      NEURO HOSPITALIST CONSULT NOTE   Requestig physician: Dr. Judd Lien   Reason for Consult: Difficulty with gait   HPI:                                                                                                                                          Vanessa Beltran is an 55 y.o. female who presents with a history of difficulty with gait that began yesterday evening. The patient relates this to a sensation of weakness in the legs. However on neurological evaluation there is no numbness or weakness in the lower extremities there is no incontinence of bowel or bladder function.  MRI of the lumbar spine has been completed. He reveals mild degenerative changes but is otherwise unremarkable. CT of the head has been completed and revealed no significant changes.  Vanessa Beltran also reports a sensation of dizziness and feeling off balance. She has a known history of migraine headaches and has experienced dizziness with her migraine headaches in the past. There is no evidence of cerebellar dysfunction in the upper extremities.  Past Medical History:  Diagnosis Date  . Medical history non-contributory     Past Surgical History:  Procedure Laterality Date  . CESAREAN SECTION    . CYSTOSCOPY N/A 10/18/2013   Procedure: CYSTOSCOPY;  Surgeon: Mitchel Honour, DO;  Location: [facility];  Service: Gynecology;  Laterality: N/A;  . LAPAROSCOPIC BILATERAL SALPINGECTOMY Right 10/18/2013   Procedure:  LAPAROSCOPIC RIGHT SALPINGECTOMY;  Surgeon: Mitchel Honour, DO;  Location: [facility];  Service: Gynecology;  Laterality: Right;  . LAPAROSCOPIC LYSIS OF ADHESIONS N/A 10/18/2013   Procedure: LAPAROSCOPIC EXTENSIVE LYSIS OF ADHESIONS;  Surgeon: Mitchel Honour, DO;  Location: [facility];  Service: Gynecology;  Laterality: N/A;  . LAPAROSCOPIC OVARIAN CYSTECTOMY    . LAPAROTOMY Left 10/18/2013   Procedure: LAPAROTOMY, LEFT SALPINGO-OOPHORECTOMY, LYSIS OF ADHESIONS;  Surgeon:  Mitchel Honour, DO;  Location: [facility];  Service: Gynecology;  Laterality: Left;  . TUBAL LIGATION      MEDICATIONS:                                                                                                                     I have reviewed the patient's current medications.  No Known Allergies   Social History:  reports that she has never smoked. She does not have any smokeless tobacco history on file. She reports that she does not drink alcohol or use drugs.  History reviewed. No pertinent family history.   ROS:                                                                                                                                       History obtained from chart review  General ROS: negative for - chills, fatigue, fever, night sweats, weight gain or weight loss Psychological ROS: negative for - behavioral disorder, hallucinations, memory difficulties, mood swings or suicidal ideation Ophthalmic ROS: negative for - blurry vision, double vision, eye pain or loss of vision ENT ROS: negative for - epistaxis, nasal discharge, oral lesions, sore throat, tinnitus or vertigo Allergy and Immunology ROS: negative for - hives or itchy/watery eyes Hematological and Lymphatic ROS: negative for - bleeding problems, bruising or swollen lymph nodes Endocrine ROS: negative for - galactorrhea, hair pattern changes, polydipsia/polyuria or temperature intolerance Respiratory ROS: negative for - cough, hemoptysis, shortness of breath or wheezing Cardiovascular ROS: negative for - chest pain, dyspnea on exertion, edema or irregular heartbeat Gastrointestinal ROS: negative for - abdominal pain, diarrhea, hematemesis, nausea/vomiting or stool incontinence Genito-Urinary ROS: negative for - dysuria, hematuria, incontinence or urinary frequency/urgency Musculoskeletal ROS: negative for - joint swelling or muscular weakness Neurological ROS: as noted in HPI Dermatological ROS: negative for rash  and skin lesion changes   General Exam                                                                                                      Blood pressure 137/78, pulse (!) 59, temperature 98.5 F (36.9 C), temperature source Oral, resp. rate 24, last menstrual period 06/05/2016, SpO2 98 %. HEENT-  Normocephalic, no lesions, without obvious abnormality.  Normal external eye and conjunctiva.  Normal TM's bilaterally.  Normal auditory canals and external ears. Normal external nose, mucus membranes and septum.  Normal pharynx. Cardiovascular- regular rate and rhythm, S1, S2 normal, no murmur, click, rub or gallop, pulses palpable throughout   Lungs- chest clear, no wheezing, rales, normal symmetric air entry, Heart exam - S1, S2 normal, no murmur, no gallop, rate regular Abdomen- soft, non-tender; bowel sounds normal; no masses,  no organomegaly Extremities- less then 2 second capillary refill Lymph-no adenopathy palpable Musculoskeletal-no joint tenderness, deformity or swelling Skin-warm and dry, no hyperpigmentation, vitiligo, or suspicious lesions  Neurological Examination Mental Status: Alert, oriented, thought  content appropriate.  Speech fluent without evidence of aphasia.  Able to follow 3 step commands without difficulty. Cranial Nerves: II: Discs flat bilaterally; Visual fields grossly normal, pupils equal, round, reactive to light and accommodation III,IV, VI: ptosis not present, extra-ocular motions intact bilaterally V,VII: smile symmetric, facial light touch sensation normal bilaterally VIII: hearing normal bilaterally IX,X: uvula rises symmetrically XI: bilateral shoulder shrug XII: midline tongue extension Motor: Right : Upper extremity   5/5    Left:     Upper extremity   5/5  Lower extremity   5/5     Lower extremity   5/5 Tone and bulk:normal tone throughout; no atrophy noted Sensory: Pinprick and light touch intact throughout, bilaterally Deep Tendon Reflexes: 2+ and  symmetric throughout Plantars: Right: downgoing   Left: downgoing Cerebellar: normal finger-to-nose, normal rapid alternating movements and normal heel-to-shin test Gait: gait is moderately unsteady   Lab Results: Basic Metabolic Panel:  Recent Labs Lab 06/25/16 0855  NA 139  K 3.1*  CL 105  CO2 25  GLUCOSE 100*  BUN 9  CREATININE 0.61  CALCIUM 9.1    Liver Function Tests: No results for input(s): AST, ALT, ALKPHOS, BILITOT, PROT, ALBUMIN in the last 168 hours. No results for input(s): LIPASE, AMYLASE in the last 168 hours. No results for input(s): AMMONIA in the last 168 hours.  CBC:  Recent Labs Lab 06/25/16 0855  WBC 3.7*  HGB 12.8  HCT 38.4  MCV 92.3  PLT 222    Cardiac Enzymes:  Recent Labs Lab 06/25/16 0855  CKTOTAL 76    Lipid Panel: No results for input(s): CHOL, TRIG, HDL, CHOLHDL, VLDL, LDLCALC in the last 168 hours.  CBG: No results for input(s): GLUCAP in the last 168 hours.  Microbiology: Results for orders placed or performed during the hospital encounter of 02/12/10  Urine culture     Status: None   Collection Time: 02/12/10  2:40 AM  Result Value Ref Range Status   Specimen Description URINE, RANDOM  Final   Special Requests ZO:XWRUE ON [id number] @[id number]   Final   Colony Count 30,000 COLONIES/ML  Final   Culture   Final    Multiple bacterial morphotypes present, none predominant. Suggest appropriate recollection if clinically indicated.   Report Status 02/13/2010 FINAL  Final    Coagulation Studies: No results for input(s): LABPROT, INR in the last 72 hours.  Imaging: Dg Chest 2 View  Result Date: 06/25/2016 CLINICAL DATA:  Chest pain for week. EXAM: CHEST  2 VIEW COMPARISON:  Radiographs of December 18, 2014. FINDINGS: The heart size and mediastinal contours are within normal limits. Both lungs are clear. No pneumothorax or pleural effusion is noted. The visualized skeletal structures are unremarkable. IMPRESSION: No active  cardiopulmonary disease. Electronically Signed   By: Lupita Raider, M.D.   On: 06/25/2016 10:06  Ct Head Wo Contrast  Result Date: 06/25/2016 CLINICAL DATA:  Posterior headache, 1 day duration.  Dizziness. EXAM: CT HEAD WITHOUT CONTRAST TECHNIQUE: Contiguous axial images were obtained from the base of the skull through the vertex without intravenous contrast. COMPARISON:  None. FINDINGS: The brain has a normal appearance without evidence of malformation, atrophy, old or acute infarction, mass lesion, hemorrhage, hydrocephalus or extra-axial collection. The calvarium is unremarkable. The paranasal sinuses, middle ears and mastoids are clear. IMPRESSION: Normal Electronically Signed   By: Paulina Fusi M.D.   On: 06/25/2016 11:23  Mr Lumbar Spine W Wo Contrast  Result Date: 06/25/2016 CLINICAL DATA:  Lower extremity  weakness involving the left greater than right legs for 2 days. Headache. EXAM: MRI LUMBAR SPINE WITHOUT AND WITH CONTRAST TECHNIQUE: Multiplanar and multiecho pulse sequences of the lumbar spine were obtained without and with intravenous contrast. CONTRAST:  23mL MULTIHANCE GADOBENATE DIMEGLUMINE 529 MG/ML IV SOLN COMPARISON:  Pelvic MRI 09/05/2013. CT abdomen and pelvis 02/12/2010. FINDINGS: Segmentation:  Normal lumbar segmentation demonstrated on prior CT. Alignment:  Normal. Vertebrae: Preserved vertebral body heights without evidence of fracture. No suspicious osseous lesion or significant vertebral marrow edema identified. Disc desiccation from L1-2 to L4-5. Conus medullaris: Extends to the L1 level and appears normal. Paraspinal and other soft tissues: 7 mm T2 hyperintense lesion in the interpolar right kidney, likely a cyst. Partially visualized uterine fibroids measuring up to 1.8 cm in size, similar to the prior MRI. Partially visualized cystic or fluid-filled tubular structure in the right adnexa measuring at least 4.2 cm in size, not present on the prior MRI. Disc levels: L1-2:  Minimal disc bulging without stenosis. L2-3:  Minimal disc bulging without stenosis. L3-4: Minimal disc bulging, small left paracentral disc protrusion with annular fissure, and mild facet arthrosis without stenosis. L4-5: Minimal disc bulging into the neural foramina and mild facet hypertrophy without stenosis. L5-S1:  Mild facet arthrosis without disc herniation or stenosis. IMPRESSION: 1. Mild lumbar spondylosis and facet arthrosis without evidence of neural impingement. 2. Partially visualized 4 cm cystic structure in the right adnexa, not present on the prior MRI and indeterminate. This could reflect an adnexal cyst, hydrosalpinx, or fluid-filled small bowel loop. Non-urgent pelvic ultrasound could be performed for further evaluation. Electronically Signed   By: Sebastian Ache M.D.   On: 06/25/2016 12:46   Assessment/Plan:  Bayard Beaver is a pleasant 55 year old patient who presents with a sensation of lower extremity weakness. However, on exam there is no evidence of any focal weakness and there is no numbness or tingling distally in the lower extremities. The reflexes are within normal limits and there is no evidence of myelopathy. There is no incontinence of bowel or bladder function. She reports that this has occurred in association with a migraine headache. Her daughter who presents with her today feels that anxiety has been a factor contributing to her symptoms.  Given the specific details of this presentation it is suspected that this may represent a vestibular migraine. Also in the differential diagnosis, but somewhat less likely, would be the possibility of a posterior circulation ischemic event. There is no evidence of neuropathy or myelopathy on exam.  She otherwise reports no history of head trauma or exposure to heavy metals.  Plan:  1. Suggest proceeding with MRI of the brain.  2. If Vanessa Beltran remains unable to walk safely overnight observation may be indicated.  3. Although considered less  likely, a urine drug screen and EtOH level might also be of some benefit.  Thank you for consulting the neurology service to assist in the care of your patient!   James A. Hilda Blades, M.D. Neurohospitalist Phone: [phone]  06/25/2016, 2:25 PM

## 2016-06-25 NOTE — ED Notes (Signed)
Walked patient around the department walked well with a little woble

## 2016-06-26 ENCOUNTER — Observation Stay (HOSPITAL_COMMUNITY): Payer: BLUE CROSS/BLUE SHIELD

## 2016-06-26 DIAGNOSIS — R270 Ataxia, unspecified: Secondary | ICD-10-CM

## 2016-06-26 LAB — BASIC METABOLIC PANEL
ANION GAP: 4 — AB (ref 5–15)
BUN: 11 mg/dL (ref 6–20)
CHLORIDE: 108 mmol/L (ref 101–111)
CO2: 28 mmol/L (ref 22–32)
Calcium: 9 mg/dL (ref 8.9–10.3)
Creatinine, Ser: 0.57 mg/dL (ref 0.44–1.00)
GFR calc non Af Amer: >60 mL/min (ref >60)
Glucose, Bld: 103 mg/dL — ABNORMAL HIGH (ref 65–99)
Potassium: 3.9 mmol/L (ref 3.5–5.1)
Sodium: 140 mmol/L (ref 135–145)

## 2016-06-26 MED ORDER — LORAZEPAM 2 MG/ML IJ SOLN
0.5000 mg | Freq: Once | INTRAMUSCULAR | Status: AC
  Administered 2016-06-26: 0.5 mg via INTRAVENOUS
  Filled 2016-06-26: qty 1

## 2016-06-26 MED ORDER — LORAZEPAM 1 MG PO TABS
1.0000 mg | ORAL_TABLET | Freq: Once | ORAL | Status: DC
  Filled 2016-06-26: qty 1

## 2016-06-26 NOTE — Discharge Summary (Signed)
Discharge Summary  Vanessa Beltran ZDG:[id number] DOB: Sep 16, 1961  PCP: Lavell Islam, MD  Admit date: 06/25/2016 Discharge date: 06/26/2016  Time spent: <30mins  Recommendations for Outpatient Follow-up:  1. F/u with PMD within a week  for hospital discharge follow up, repeat cbc/bmp at follow up, pmd to arrange pelvic ultrasound to follow up 4cm cystic structure in right adnexa 2. Outpatient PT 3. Rolling walker   Discharge Diagnoses:  Active Hospital Problems   Diagnosis Date Noted  . Ataxia   . Leg weakness, bilateral 06/25/2016  . Migraine 06/25/2016  . Headache 06/25/2016  . Hypokalemia 06/25/2016  . Bradycardia 06/25/2016  . Abnormal MRI 06/25/2016  . Allergic rhinitis 11/12/2009  . LOW BACK PAIN, CHRONIC 10/29/2009    Resolved Hospital Problems   Diagnosis Date Noted Date Resolved  No resolved problems to display.    Discharge Condition: stable  Diet recommendation: regular diet  There were no vitals filed for this visit.  History of present illness:  Chief Complaint: difficulty walking  HPI: Vanessa Beltran is a 55 y.o. female with medical history significant for chronic low back pain and neck pain presents to the emergency Department chief complaint of bilateral lower extremity weakness and gait changes. Initial evaluation mostly unremarkable but being admitted for observation overnight to have MRI of the brain in the morning and while gait improve still unsteady.  Information is obtained from the patient through her daughter who serves as interpreter and the chart. She reports about 8:00 last evening she developed lower extremity weakness and difficulty walking. She reports she was concerned about a stroke as she has family members who have had strokes and they had similar symptoms. She reports the weakness started in the left leg than the right leg developed weakness as well. Weakness is worse today. Associated symptoms include worsening of her chronic back  pain. She took Tylenol and she reports this improved her back pain. In addition she experienced nausea and migraine yesterday during the day. She denies any numbness or tingling of her lower extremities. She denies any loss of control of bowel or bladder. She denies any falls or recent trauma. She denies visual disturbances difficulty speaking or swallowing. She denies numbness tingling or weakness of her upper extremities. 4 to generalized fatigue. She denies fever chills dysuria hematuria frequency or urgency. She denies constipation diarrhea melena or bright red blood per rectum    ED Course: In emergency department she's provided with pain medicine Zofran IV fluids. She is afebrile hemodynamically stable and not hypoxic.  Hospital Course:  Principal Problem:   Ataxia Active Problems:   Allergic rhinitis   LOW BACK PAIN, CHRONIC   Leg weakness, bilateral   Migraine   Headache   Hypokalemia   Bradycardia   Abnormal MRI  Ataxia/lower back pain:  Neurology input appreciated uds and etoh negative  MRI brain no acute findings,  MRI spine with Mild lumbar spondylosis and facet arthrosis without evidence of neural impingement Physical therapy eval recommend outpatient PT and rolling walker  4 cm cystic structure in the right adnexa: pelvic US per pmd  Daughter updated in room   Procedures:  Mri brain/spine  Consultations:  neurology  Discharge Exam: BP 133/85 (BP Location: Left Arm)   Pulse 74   Temp 98.2 F (36.8 C) (Oral)   Resp 18   LMP 06/05/2016   SpO2 96%   General: aaox3 Cardiovascular: rrr Respiratory: CTABL  Discharge Instructions You were cared for by a hospitalist during your hospital stay.  If you have any questions about your discharge medications or the care you received while you were in the hospital after you are discharged, you can call the unit and asked to speak with the hospitalist on call if the hospitalist that took care of you is not  available. Once you are discharged, your primary care physician will handle any further medical issues. Please note that NO REFILLS for any discharge medications will be authorized once you are discharged, as it is imperative that you return to your primary care physician (or establish a relationship with a primary care physician if you do not have one) for your aftercare needs so that they can reassess your need for medications and monitor your lab values.  Discharge Instructions    Ambulatory referral to Physical Therapy    Complete by:  As directed   Diet general    Complete by:  As directed   For home use only DME 4 wheeled rolling walker with seat    Complete by:  As directed   Increase activity slowly    Complete by:  As directed       Medication List    TAKE these medications   cetirizine 10 MG tablet Commonly known as:  ZYRTEC Take 10 mg by mouth at bedtime.      No Known Allergies Follow-up Information    CLOWARD,DAVIS L, MD Follow up in 1 week(s).   Specialty:  Internal Medicine Why:  hospital discharge follow up, pmd to schedule plevic [place] to follow up on 4 cm cystic structure in the right adnexa Contact information: [address] [place] [address] [phone]            The results of significant diagnostics from this hospitalization (including imaging, microbiology, ancillary and laboratory) are listed below for reference.    Significant Diagnostic Studies: Dg Chest 2 View  Result Date: 06/25/2016 CLINICAL DATA:  Chest pain for week. EXAM: CHEST  2 VIEW COMPARISON:  Radiographs of December 18, 2014. FINDINGS: The heart size and mediastinal contours are within normal limits. Both lungs are clear. No pneumothorax or pleural effusion is noted. The visualized skeletal structures are unremarkable. IMPRESSION: No active cardiopulmonary disease. Electronically Signed   By: Lupita Raider, M.D.   On: 06/25/2016 10:06  Ct Head Wo Contrast  Result Date:  06/25/2016 CLINICAL DATA:  Posterior headache, 1 day duration.  Dizziness. EXAM: CT HEAD WITHOUT CONTRAST TECHNIQUE: Contiguous axial images were obtained from the base of the skull through the vertex without intravenous contrast. COMPARISON:  None. FINDINGS: The brain has a normal appearance without evidence of malformation, atrophy, old or acute infarction, mass lesion, hemorrhage, hydrocephalus or extra-axial collection. The calvarium is unremarkable. The paranasal sinuses, middle ears and mastoids are clear. IMPRESSION: Normal Electronically Signed   By: Paulina Fusi M.D.   On: 06/25/2016 11:23  Mr Brain Wo Contrast  Result Date: 06/26/2016 CLINICAL DATA:  Unsteady gait.  Lower extremity weakness. EXAM: MRI HEAD WITHOUT CONTRAST TECHNIQUE: Multiplanar, multiecho pulse sequences of the brain and surrounding structures were obtained without intravenous contrast. COMPARISON:  CT head 06/25/2016 FINDINGS: Ventricle size normal.  Cerebral volume normal. Negative for acute infarct. Several small hyperintensities in the right frontal white matter. Remaining white matter normal. Basal ganglia brainstem and cerebellum normal. Negative for intracranial hemorrhage.  No fluid collection Negative for mass or edema.  No shift of the midline structures. Mild mucosal edema paranasal sinuses. Normal orbital structures. Pituitary not enlarged. IMPRESSION: MRI brain within  normal limits for age Mild mucosal edema in the paranasal sinuses. Electronically Signed   By: Marlan Palau M.D.   On: 06/26/2016 09:29  Mr Lumbar Spine W Wo Contrast  Result Date: 06/25/2016 CLINICAL DATA:  Lower extremity weakness involving the left greater than right legs for 2 days. Headache. EXAM: MRI LUMBAR SPINE WITHOUT AND WITH CONTRAST TECHNIQUE: Multiplanar and multiecho pulse sequences of the lumbar spine were obtained without and with intravenous contrast. CONTRAST:  10mL MULTIHANCE GADOBENATE DIMEGLUMINE 529 MG/ML IV SOLN COMPARISON:   Pelvic MRI 09/05/2013. CT abdomen and pelvis 02/12/2010. FINDINGS: Segmentation:  Normal lumbar segmentation demonstrated on prior CT. Alignment:  Normal. Vertebrae: Preserved vertebral body heights without evidence of fracture. No suspicious osseous lesion or significant vertebral marrow edema identified. Disc desiccation from L1-2 to L4-5. Conus medullaris: Extends to the L1 level and appears normal. Paraspinal and other soft tissues: 7 mm T2 hyperintense lesion in the interpolar right kidney, likely a cyst. Partially visualized uterine fibroids measuring up to 1.8 cm in size, similar to the prior MRI. Partially visualized cystic or fluid-filled tubular structure in the right adnexa measuring at least 4.2 cm in size, not present on the prior MRI. Disc levels: L1-2: Minimal disc bulging without stenosis. L2-3:  Minimal disc bulging without stenosis. L3-4: Minimal disc bulging, small left paracentral disc protrusion with annular fissure, and mild facet arthrosis without stenosis. L4-5: Minimal disc bulging into the neural foramina and mild facet hypertrophy without stenosis. L5-S1:  Mild facet arthrosis without disc herniation or stenosis. IMPRESSION: 1. Mild lumbar spondylosis and facet arthrosis without evidence of neural impingement. 2. Partially visualized 4 cm cystic structure in the right adnexa, not present on the prior MRI and indeterminate. This could reflect an adnexal cyst, hydrosalpinx, or fluid-filled small bowel loop. Non-urgent pelvic ultrasound could be performed for further evaluation. Electronically Signed   By: Sebastian Ache M.D.   On: 06/25/2016 12:46   Microbiology: No results found for this or any previous visit (from the past 240 hour(s)).   Labs: Basic Metabolic Panel:  Recent Labs Lab 06/25/16 0855 06/26/16 0539  NA 139 140  K 3.1* 3.9  CL 105 108  CO2 25 28  GLUCOSE 100* 103*  BUN 9 11  CREATININE 0.61 0.57  CALCIUM 9.1 9.0   Liver Function Tests: No results for  input(s): AST, ALT, ALKPHOS, BILITOT, PROT, ALBUMIN in the last 168 hours. No results for input(s): LIPASE, AMYLASE in the last 168 hours. No results for input(s): AMMONIA in the last 168 hours. CBC:  Recent Labs Lab 06/25/16 0855  WBC 3.7*  HGB 12.8  HCT 38.4  MCV 92.3  PLT 222   Cardiac Enzymes:  Recent Labs Lab 06/25/16 0855  CKTOTAL 76   BNP: BNP (last 3 results) No results for input(s): BNP in the last 8760 hours.  ProBNP (last 3 results) No results for input(s): PROBNP in the last 8760 hours.  CBG: No results for input(s): GLUCAP in the last 168 hours.     SignedAlbertine Grates MD, PhD  [facility] 06/26/2016, 3:01 PM

## 2016-06-26 NOTE — Care Management Note (Signed)
Case Management Note  Patient Details  Name: Lenis Kozar MRN: [id number] Date of Birth: [birth year]  Subjective/Objective:                 Patient provided with list of OP PT providers to follow up with. RW ordered and will be delivered to room prior to discharge.   Action/Plan:   Expected Discharge Date:                  Expected Discharge Plan:  Home/Self Care  In-House Referral:     Discharge planning Services  CM Consult  Post Acute Care Choice:  Durable Medical Equipment Choice offered to:  Patient  DME Arranged:  Walker rolling DME Agency:  [facility]  HH Arranged:  NA HH Agency:     Status of Service:  Completed, signed off  If discussed at Long Length of Stay Meetings, dates discussed:    Additional Comments:  Lawerance Sabal, RN 06/26/2016, 4:01 PM

## 2016-06-26 NOTE — Progress Notes (Signed)
Patient's walker never arrived to the room. Patient left home for discharge.  Family willing to come pick it up tomorrow 7/29 from the hospital. Silvestre Mesi (patient's daughter) is willing to call. Please call [phone] for pick up.

## 2016-06-26 NOTE — Progress Notes (Signed)
Neomia Glass to be D/C'd Home per MD order. Discussed with the patient and all questions fully answered.    Medication List    TAKE these medications   cetirizine 10 MG tablet Commonly known as:  ZYRTEC Take 10 mg by mouth at bedtime.       VVS, Skin clean, dry and intact without evidence of skin break down, no evidence of skin tears noted.  IV catheter discontinued intact. Site without signs and symptoms of complications. Dressing and pressure applied.  An After Visit Summary was printed and given to the patient.  Patient escorted via WC, and D/C home via private auto.  Beckey Downing F  06/26/2016 5:25 PM

## 2016-06-26 NOTE — Evaluation (Signed)
Physical Therapy Evaluation Patient Details Name: Vanessa Beltran MRN: [id number] DOB: [birth year] Today's Date: 06/26/2016   History of Present Illness  Patient is a 55 yo female admitted 06/25/16 with dizziness, ataxia, gait instability, brady. Patient with nausea and migraine day pta.  MRI lumbar spine and brain negative per chart.  Per Neurology, ? vestibular migraine.  PMH:  migraines with associated dizziness, chronic back and neck pain    Clinical Impression  Patient presents with problems listed below.  Will benefit from acute PT to maximize functional independence prior to d/c home.  Per chart, patient with dizziness and ataxia on admit.  Today, patient reports she initially had difficulty with LLE, and now RLE.  Patient reports no dizziness today.  During evaluation, did note Rt knee instability/buckling during stance phase of gait, causing gait instability.  Noted similar instability/buckling when testing balance and single-leg stance on RLE. Noted Lumbar MRI negative per chart.   Recommended to patient and daughter that patient use RW for gait for safety/stability.  Also recommend f/u OP PT for continued therapy at d/c.    Follow Up Recommendations Outpatient PT;Supervision - Intermittent    Equipment Recommendations  Rolling walker with 5" wheels    Recommendations for Other Services       Precautions / Restrictions Precautions Precautions: None Restrictions Weight Bearing Restrictions: No      Mobility  Bed Mobility Overal bed mobility: Independent                Transfers Overall transfer level: Needs assistance Equipment used: None Transfers: Sit to/from Stand Sit to Stand: Supervision         General transfer comment: Slightly unsteady during stance. Supervision for safety only.  Ambulation/Gait Ambulation/Gait assistance: Min guard;Min assist Ambulation Distance (Feet): 140 Feet Assistive device: None Gait Pattern/deviations: Step-through  pattern;Decreased stance time - right;Decreased stride length Gait velocity: decreased Gait velocity interpretation: Below normal speed for age/gender General Gait Details: Note Rt knee buckling during gait, causing gait instability.   No ataxia or dizziness noted.  Stairs            Wheelchair Mobility    Modified Rankin (Stroke Patients Only)       Balance Overall balance assessment: Needs assistance         Standing balance support: No upper extremity supported;During functional activity Standing balance-Leahy Scale: Good Standing balance comment: Balance impacted by Rt knee buckling. Single Leg Stance - Right Leg: 10 (With knee instability) Single Leg Stance - Left Leg: 25     Rhomberg - Eyes Opened: 30 Rhomberg - Eyes Closed: 30 (Minimal sway)                 Pertinent Vitals/Pain Pain Assessment: Faces Faces Pain Scale: Hurts little more Pain Location: Back Pain Descriptors / Indicators: Aching;Sore Pain Intervention(s): Monitored during session;Repositioned    Home Living Family/patient expects to be discharged to:: Private residence Living Arrangements: Spouse/significant other;Children (Husband and daughter) Available Help at Discharge: Family;Available PRN/intermittently (All work) Type of Home: House Home Access: Stairs to enter Entrance Stairs-Rails: Lawyer of Steps: 3 Home Layout: One level;Laundry or work area in [employer]: None      Prior Function Level of Independence: Independent         Comments: Works Information systems manager        Extremity/Trunk Assessment   Upper Extremity Assessment: Overall WFL for tasks assessed  Lower Extremity Assessment: Overall WFL for tasks assessed;RLE deficits/detail RLE Deficits / Details: Knee extension strength tests normal in sitting.  However, note knee buckling during gait.  When testing single leg stance on RLE, noted  Rt knee instability and buckling.    Cervical / Trunk Assessment: Normal  Communication   Communication: Prefers language other than [place];Interpreter utilized (Daughter interpreting during session)  Cognition Arousal/Alertness: Awake/alert Behavior During Therapy: WFL for tasks assessed/performed Overall Cognitive Status: Within Functional Limits for tasks assessed                      General Comments      Exercises        Assessment/Plan    PT Assessment Patient needs continued PT services  PT Diagnosis Abnormality of gait;Acute pain   PT Problem List Decreased balance;Decreased mobility;Decreased knowledge of use of DME;Pain  PT Treatment Interventions DME instruction;Gait training;Stair training;Functional mobility training;Therapeutic activities;Therapeutic exercise;Balance training;Patient/family education   PT Goals (Current goals can be found in the Care Plan section) Acute Rehab PT Goals Patient Stated Goal: None stated PT Goal Formulation: With patient/family Time For Goal Achievement: 07/03/16 Potential to Achieve Goals: Good    Frequency Min 3X/week   Barriers to discharge Decreased caregiver support Family members work.  Does not have assist during day per daughter.    Co-evaluation               End of Session Equipment Utilized During Treatment: Gait belt Activity Tolerance: Patient tolerated treatment well Patient left: in bed;with call bell/phone within reach;with family/visitor present Nurse Communication: Mobility status (Recommend use of RW and f/u OP PT)    Functional Assessment Tool Used: Clinical judgement Functional Limitation: Mobility: Walking and moving around Mobility: Walking and Moving Around Current Status (X9024): At least 1 percent but less than 20 percent impaired, limited or restricted Mobility: Walking and Moving Around Goal Status [phone]): 0 percent impaired, limited or restricted    Time: 3299-2426 PT Time  Calculation (min) (ACUTE ONLY): 23 min   Charges:   PT Evaluation $PT Eval Low Complexity: 1 Procedure PT Treatments $Gait Training: 8-22 mins   PT G Codes:   PT G-Codes **NOT FOR INPATIENT CLASS** Functional Assessment Tool Used: Clinical judgement Functional Limitation: Mobility: Walking and moving around Mobility: Walking and Moving Around Current Status (S3419): At least 1 percent but less than 20 percent impaired, limited or restricted Mobility: Walking and Moving Around Goal Status [phone]): 0 percent impaired, limited or restricted    Vena Austria 06/26/2016, 11:32 AM Durenda Hurt. Renaldo Fiddler, [facility] Acute Rehab Services Pager [phone]

## 2017-03-27 IMAGING — MR MR LUMBAR SPINE WO/W CM
4 of 7 series · 18 of 48 positions shown · IV contrast (multihance)
Comparison: Pelvic MRI 09/05/2013. CT abdomen and pelvis
02/12/2010.

CLINICAL DATA: Lower extremity weakness involving the left greater
than right legs for 2 days. Headache.

EXAM:
MRI LUMBAR SPINE WITHOUT AND WITH CONTRAST
TECHNIQUE: Multiplanar and multiecho pulse sequences of the lumbar spine were
obtained without and with intravenous contrast.
CONTRAST:  10mL MULTIHANCE GADOBENATE DIMEGLUMINE 529 MG/ML IV SOLN

[Series 2: T2 · sagittal · 4.0mm · 0.55mm/px · 5 of 13 slices shown (1 of 2)]
[im 1/13]
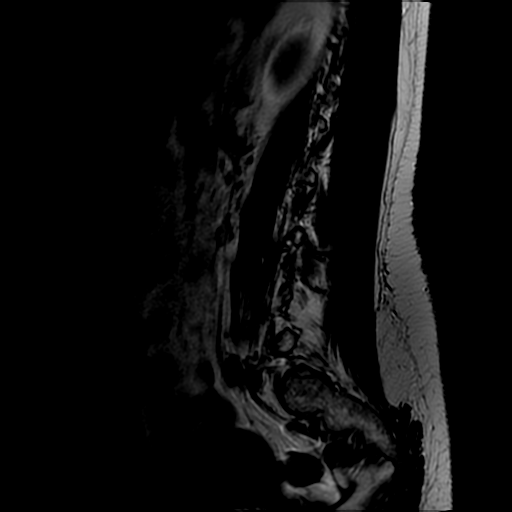
[im 4/13]
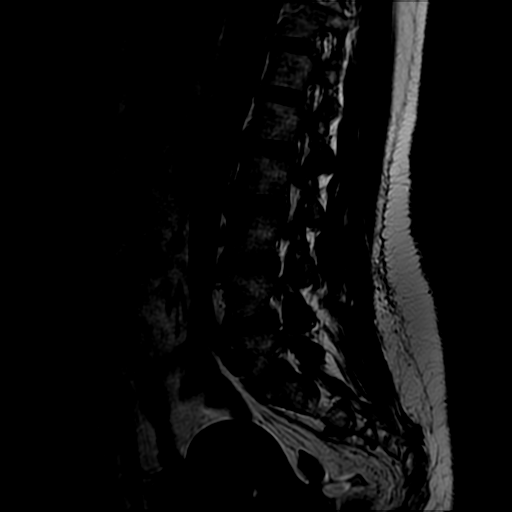
[im 7/13]
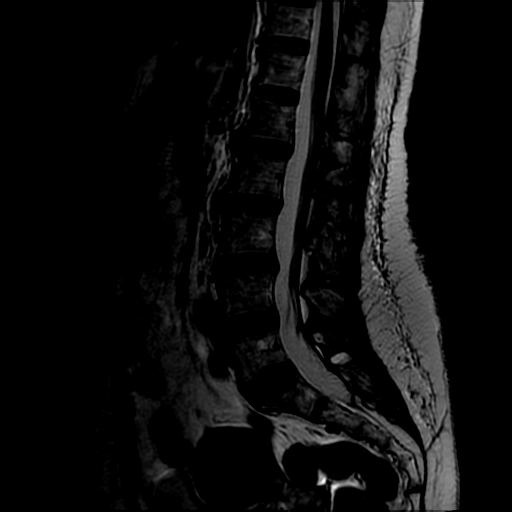
[im 10/13]
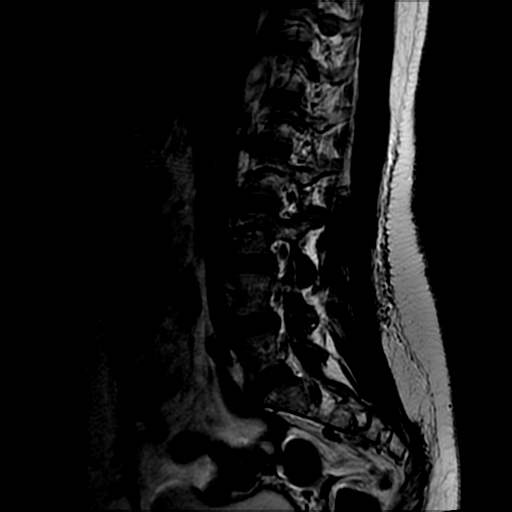
[im 13/13]
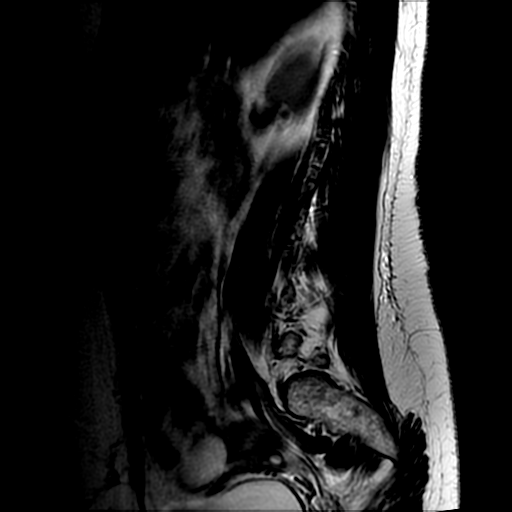

[Series 4: T1 · sagittal · 4.0mm · 0.55mm/px · 3 of 13 slices shown (1 of 2)]
[im 1/13]
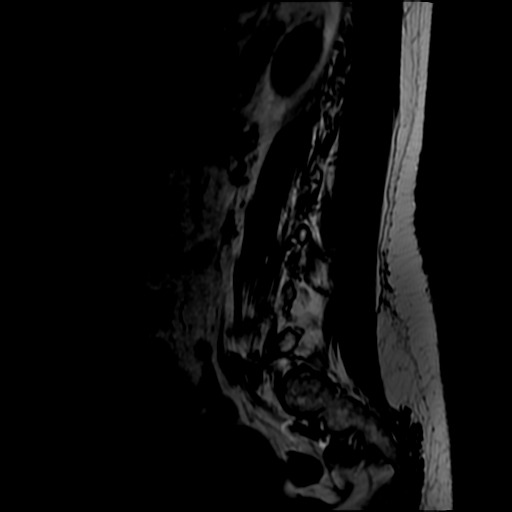
[im 9/13]
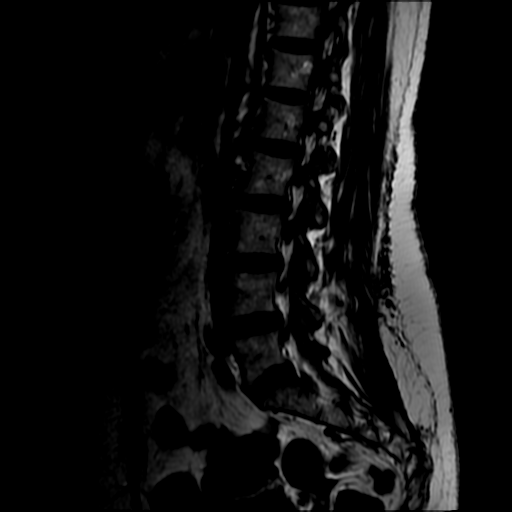
[im 13/13]
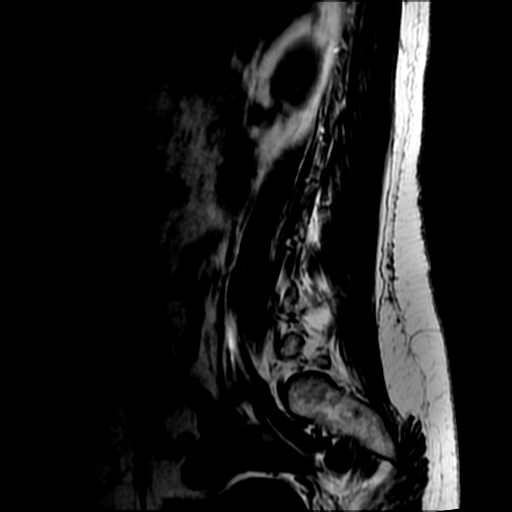

[Series 5: T2 · axial · 4.0mm · 0.39mm/px · z∈[-44,+97]mm · 7 of 30 slices shown (2 of 2)]
[im 1/30]
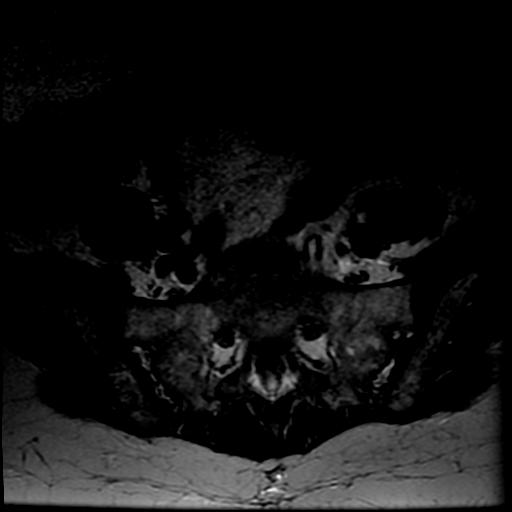
[im 4/30]
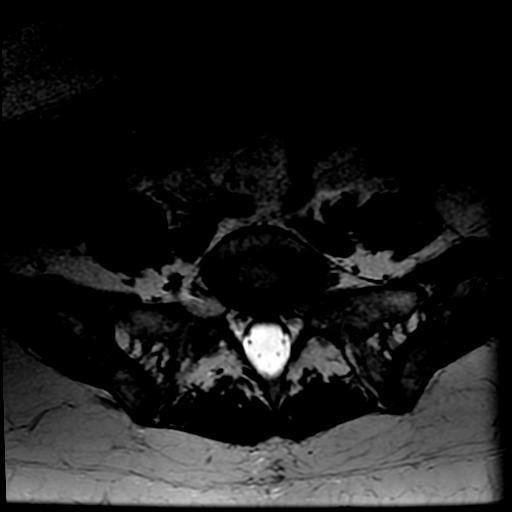
[im 10/30]
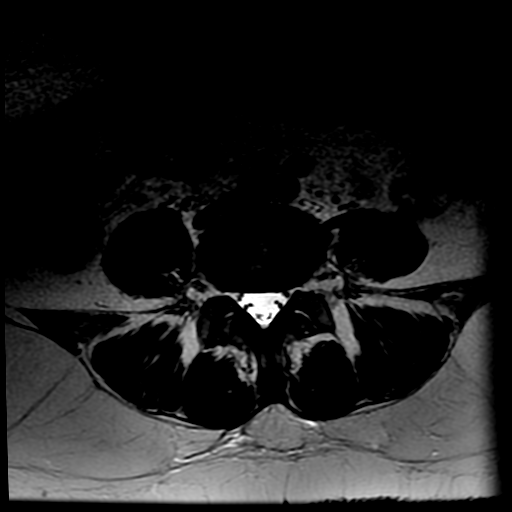
[im 13/30]
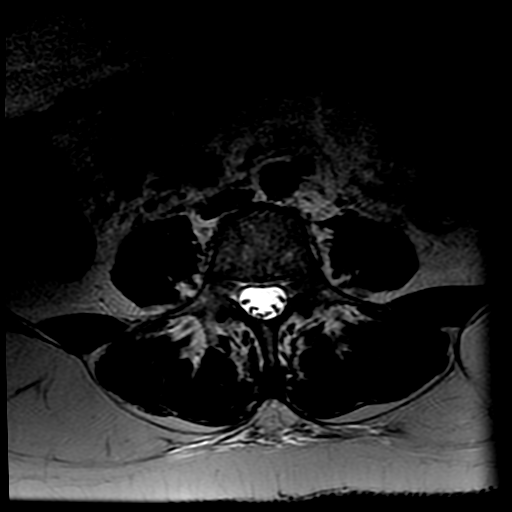
[im 17/30]
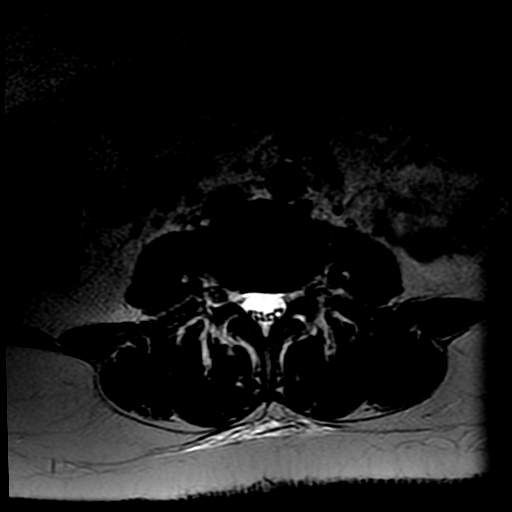
[im 20/30]
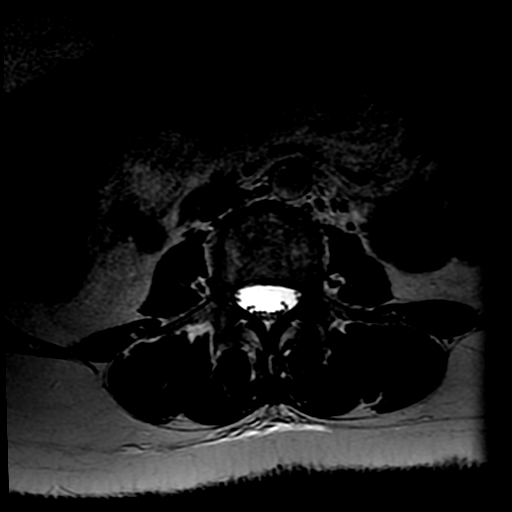
[im 26/30]
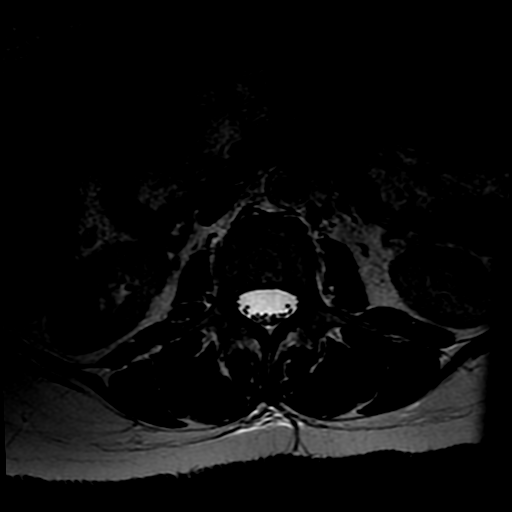

[Series 6: T1 · axial · 4.0mm · 0.39mm/px · z∈[-29,+97]mm · 3 of 30 slices shown (2 of 2)]
[im 4/30]
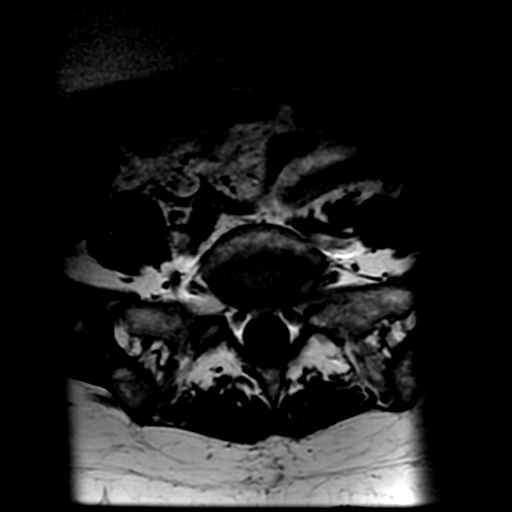
[im 17/30]
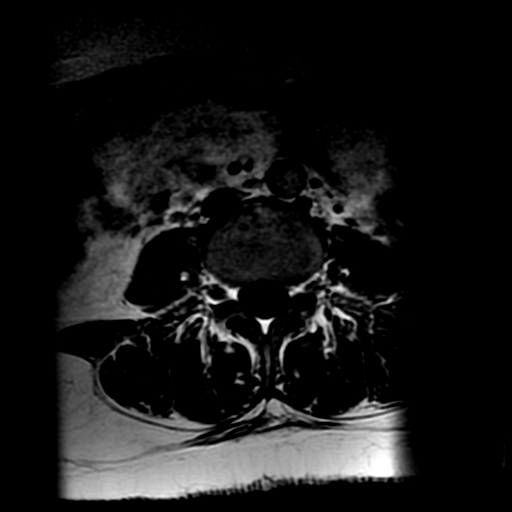
[im 26/30]
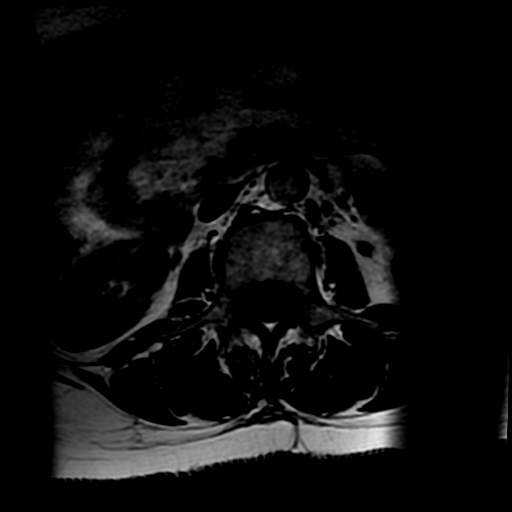

[18 of 48 positions shown; findings below may reference images not displayed]

FINDINGS: Segmentation:  Normal lumbar segmentation demonstrated on prior CT.

Alignment:  Normal.

Vertebrae: Preserved vertebral body heights without evidence of
fracture. No suspicious osseous lesion or significant vertebral
marrow edema identified. Disc desiccation from L1-2 to L4-5.

Conus medullaris: Extends to the L1 level and appears normal.

Paraspinal and other soft tissues: 7 mm T2 hyperintense lesion in
the interpolar right kidney, likely a cyst. Partially visualized
uterine fibroids measuring up to 1.8 cm in size, similar to the
prior MRI. Partially visualized cystic or fluid-filled tubular
structure in the right adnexa measuring at least 4.2 cm in size, not
present on the prior MRI.

Disc levels:

L1-2: Minimal disc bulging without stenosis.

L2-3:  Minimal disc bulging without stenosis.

L3-4: Minimal disc bulging, small left paracentral disc protrusion
with annular fissure, and mild facet arthrosis without stenosis.

L4-5: Minimal disc bulging into the neural foramina and mild facet
hypertrophy without stenosis.

L5-S1:  Mild facet arthrosis without disc herniation or stenosis.
IMPRESSION: 1. Mild lumbar spondylosis and facet arthrosis without evidence of
neural impingement.
2. Partially visualized 4 cm cystic structure in the right adnexa,
not present on the prior MRI and indeterminate. This could reflect
an adnexal cyst, hydrosalpinx, or fluid-filled small bowel loop.
Non-urgent pelvic ultrasound could be performed for further
evaluation.
